# Patient Record
Sex: Female | Born: 1955 | Race: White | Hispanic: No | Marital: Married | Smoking: Current every day smoker
Health system: Southern US, Community
[De-identification: ages and names within clinical notes are randomized; demographics above are authoritative.]

## PROBLEM LIST (undated history)

## (undated) DIAGNOSIS — Z9889 Other specified postprocedural states: Secondary | ICD-10-CM

## (undated) DIAGNOSIS — T8859XA Other complications of anesthesia, initial encounter: Secondary | ICD-10-CM

## (undated) DIAGNOSIS — F99 Mental disorder, not otherwise specified: Secondary | ICD-10-CM

## (undated) DIAGNOSIS — I340 Nonrheumatic mitral (valve) insufficiency: Secondary | ICD-10-CM

## (undated) DIAGNOSIS — C4491 Basal cell carcinoma of skin, unspecified: Secondary | ICD-10-CM

## (undated) DIAGNOSIS — G43909 Migraine, unspecified, not intractable, without status migrainosus: Secondary | ICD-10-CM

## (undated) DIAGNOSIS — T4145XA Adverse effect of unspecified anesthetic, initial encounter: Secondary | ICD-10-CM

## (undated) DIAGNOSIS — I10 Essential (primary) hypertension: Secondary | ICD-10-CM

## (undated) DIAGNOSIS — R112 Nausea with vomiting, unspecified: Secondary | ICD-10-CM

## (undated) DIAGNOSIS — K219 Gastro-esophageal reflux disease without esophagitis: Secondary | ICD-10-CM

## (undated) DIAGNOSIS — IMO0002 Reserved for concepts with insufficient information to code with codable children: Secondary | ICD-10-CM

## (undated) DIAGNOSIS — E785 Hyperlipidemia, unspecified: Secondary | ICD-10-CM

## (undated) HISTORY — PX: PARTIAL HYSTERECTOMY: SHX80

## (undated) HISTORY — PX: APPENDECTOMY: SHX54

## (undated) HISTORY — PX: COSMETIC SURGERY: SHX468

## (undated) HISTORY — PX: OTHER SURGICAL HISTORY: SHX169

## (undated) HISTORY — DX: Mental disorder, not otherwise specified: F99

## (undated) HISTORY — DX: Reserved for concepts with insufficient information to code with codable children: IMO0002

## (undated) HISTORY — PX: ABDOMINAL HYSTERECTOMY: SHX81

## (undated) HISTORY — DX: Basal cell carcinoma of skin, unspecified: C44.91

## (undated) HISTORY — PX: TONSILLECTOMY: SUR1361

## (undated) HISTORY — DX: Hyperlipidemia, unspecified: E78.5

## (undated) HISTORY — PX: ANAL FISSURE REPAIR: SHX2312

## (undated) HISTORY — DX: Essential (primary) hypertension: I10

---

## 1980-07-23 DIAGNOSIS — IMO0002 Reserved for concepts with insufficient information to code with codable children: Secondary | ICD-10-CM

## 1980-07-23 DIAGNOSIS — R87619 Unspecified abnormal cytological findings in specimens from cervix uteri: Secondary | ICD-10-CM

## 1980-07-23 HISTORY — DX: Reserved for concepts with insufficient information to code with codable children: IMO0002

## 1980-07-23 HISTORY — DX: Unspecified abnormal cytological findings in specimens from cervix uteri: R87.619

## 1999-07-18 ENCOUNTER — Encounter: Payer: Self-pay | Admitting: Emergency Medicine

## 1999-07-18 ENCOUNTER — Emergency Department (HOSPITAL_COMMUNITY): Admission: EM | Admit: 1999-07-18 | Discharge: 1999-07-18 | Payer: Self-pay | Admitting: Emergency Medicine

## 2000-05-17 ENCOUNTER — Ambulatory Visit (HOSPITAL_COMMUNITY): Admission: RE | Admit: 2000-05-17 | Discharge: 2000-05-17 | Payer: Self-pay | Admitting: Neurosurgery

## 2000-05-17 ENCOUNTER — Encounter: Payer: Self-pay | Admitting: Neurosurgery

## 2000-05-21 ENCOUNTER — Encounter: Payer: Self-pay | Admitting: Neurosurgery

## 2000-05-21 ENCOUNTER — Ambulatory Visit (HOSPITAL_COMMUNITY): Admission: RE | Admit: 2000-05-21 | Discharge: 2000-05-21 | Payer: Self-pay | Admitting: Neurosurgery

## 2000-07-15 ENCOUNTER — Ambulatory Visit (HOSPITAL_COMMUNITY): Admission: RE | Admit: 2000-07-15 | Discharge: 2000-07-15 | Payer: Self-pay | Admitting: Neurosurgery

## 2000-07-15 ENCOUNTER — Encounter: Payer: Self-pay | Admitting: Neurosurgery

## 2000-07-30 ENCOUNTER — Encounter: Payer: Self-pay | Admitting: Neurosurgery

## 2000-07-30 ENCOUNTER — Ambulatory Visit (HOSPITAL_COMMUNITY): Admission: RE | Admit: 2000-07-30 | Discharge: 2000-07-30 | Payer: Self-pay | Admitting: Neurosurgery

## 2000-08-13 ENCOUNTER — Encounter: Payer: Self-pay | Admitting: Neurosurgery

## 2000-08-13 ENCOUNTER — Ambulatory Visit: Admission: RE | Admit: 2000-08-13 | Discharge: 2000-08-13 | Payer: Self-pay | Admitting: Neurosurgery

## 2001-04-08 ENCOUNTER — Other Ambulatory Visit: Admission: RE | Admit: 2001-04-08 | Discharge: 2001-04-08 | Payer: Self-pay | Admitting: *Deleted

## 2005-03-29 ENCOUNTER — Other Ambulatory Visit: Admission: RE | Admit: 2005-03-29 | Discharge: 2005-03-29 | Payer: Self-pay | Admitting: *Deleted

## 2006-09-23 ENCOUNTER — Inpatient Hospital Stay (HOSPITAL_COMMUNITY): Admission: EM | Admit: 2006-09-23 | Discharge: 2006-09-25 | Payer: Self-pay | Admitting: Emergency Medicine

## 2006-09-24 ENCOUNTER — Encounter: Payer: Self-pay | Admitting: Cardiology

## 2008-09-22 ENCOUNTER — Ambulatory Visit: Payer: Self-pay | Admitting: Cardiology

## 2008-09-23 ENCOUNTER — Inpatient Hospital Stay (HOSPITAL_COMMUNITY): Admission: EM | Admit: 2008-09-23 | Discharge: 2008-09-24 | Payer: Self-pay | Admitting: Emergency Medicine

## 2009-05-15 ENCOUNTER — Emergency Department (HOSPITAL_COMMUNITY): Admission: EM | Admit: 2009-05-15 | Discharge: 2009-05-15 | Payer: Self-pay | Admitting: Emergency Medicine

## 2009-06-23 ENCOUNTER — Ambulatory Visit (HOSPITAL_COMMUNITY): Admission: RE | Admit: 2009-06-23 | Discharge: 2009-06-23 | Payer: Self-pay | Admitting: Plastic Surgery

## 2009-06-24 ENCOUNTER — Ambulatory Visit (HOSPITAL_BASED_OUTPATIENT_CLINIC_OR_DEPARTMENT_OTHER): Admission: RE | Admit: 2009-06-24 | Discharge: 2009-06-24 | Payer: Self-pay | Admitting: Plastic Surgery

## 2009-09-27 ENCOUNTER — Ambulatory Visit (HOSPITAL_COMMUNITY): Admission: RE | Admit: 2009-09-27 | Discharge: 2009-09-27 | Payer: Self-pay | Admitting: Plastic Surgery

## 2010-10-24 LAB — POCT HEMOGLOBIN-HEMACUE: Hemoglobin: 14.7 g/dL (ref 12.0–15.0)

## 2010-11-02 LAB — POCT I-STAT, CHEM 8
BUN: <3 mg/dL — ABNORMAL LOW (ref 6–23)
Calcium, Ion: 1 mmol/L — ABNORMAL LOW (ref 1.12–1.32)
Chloride: 102 mEq/L (ref 96–112)
Creatinine, Ser: 0.8 mg/dL (ref 0.4–1.2)
Glucose, Bld: 89 mg/dL (ref 70–99)
HCT: 38 % (ref 36.0–46.0)
Hemoglobin: 12.9 g/dL (ref 12.0–15.0)
Potassium: 3.8 mEq/L (ref 3.5–5.1)
Sodium: 133 mEq/L — ABNORMAL LOW (ref 135–145)
TCO2: 25 mmol/L (ref 0–100)

## 2010-11-02 LAB — CARDIAC PANEL(CRET KIN+CKTOT+MB+TROPI)
CK, MB: 1.2 ng/mL (ref 0.3–4.0)
CK, MB: 1.4 ng/mL (ref 0.3–4.0)
Relative Index: INVALID (ref 0.0–2.5)
Relative Index: INVALID (ref 0.0–2.5)
Total CK: 52 U/L (ref 7–177)
Troponin I: <0.01 ng/mL (ref 0.00–0.06)
Troponin I: <0.01 ng/mL (ref 0.00–0.06)

## 2010-11-02 LAB — CBC
HCT: 34.7 % — ABNORMAL LOW (ref 36.0–46.0)
HCT: 36.1 % (ref 36.0–46.0)
Hemoglobin: 12.1 g/dL (ref 12.0–15.0)
Hemoglobin: 12.6 g/dL (ref 12.0–15.0)
MCHC: 34.9 g/dL (ref 30.0–36.0)
MCV: 99 fL (ref 78.0–100.0)
Platelets: 275 10*3/uL (ref 150–400)
Platelets: 298 10*3/uL (ref 150–400)
RBC: 3.51 MIL/uL — ABNORMAL LOW (ref 3.87–5.11)
RDW: 14.2 % (ref 11.5–15.5)
WBC: 7.7 10*3/uL (ref 4.0–10.5)
WBC: 7.9 10*3/uL (ref 4.0–10.5)

## 2010-11-02 LAB — POCT CARDIAC MARKERS
CKMB, poc: <1 ng/mL — ABNORMAL LOW (ref 1.0–8.0)
CKMB, poc: <1 ng/mL — ABNORMAL LOW (ref 1.0–8.0)
Myoglobin, poc: 39 ng/mL (ref 12–200)
Myoglobin, poc: 42.4 ng/mL (ref 12–200)
Troponin i, poc: <0.05 ng/mL (ref 0.00–0.09)
Troponin i, poc: <0.05 ng/mL (ref 0.00–0.09)

## 2010-11-02 LAB — BASIC METABOLIC PANEL
BUN: 3 mg/dL — ABNORMAL LOW (ref 6–23)
CO2: 27 mEq/L (ref 19–32)
Calcium: 8.7 mg/dL (ref 8.4–10.5)
Chloride: 103 mEq/L (ref 96–112)
Creatinine, Ser: 0.59 mg/dL (ref 0.4–1.2)
GFR calc Af Amer: >60 mL/min (ref >60)
GFR calc non Af Amer: >60 mL/min (ref >60)
Glucose, Bld: 95 mg/dL (ref 70–99)
Potassium: 4.2 mEq/L (ref 3.5–5.1)
Sodium: 136 mEq/L (ref 135–145)

## 2010-11-02 LAB — DIFFERENTIAL
Eosinophils Relative: 2 % (ref 0–5)
Lymphocytes Relative: 41 % (ref 12–46)
Lymphs Abs: 3.3 10*3/uL (ref 0.7–4.0)
Monocytes Absolute: 0.6 10*3/uL (ref 0.1–1.0)

## 2010-11-02 LAB — LIPID PANEL
Cholesterol: 172 mg/dL (ref 0–200)
HDL: 53 mg/dL (ref >39)
LDL Cholesterol: 91 mg/dL (ref 0–99)
Triglycerides: 142 mg/dL (ref <150)

## 2010-11-02 LAB — HEPARIN LEVEL (UNFRACTIONATED): Heparin Unfractionated: <0.1 IU/mL — ABNORMAL LOW (ref 0.30–0.70)

## 2010-11-02 LAB — PROTIME-INR
INR: 1 (ref 0.00–1.49)
Prothrombin Time: 12.9 seconds (ref 11.6–15.2)

## 2010-11-02 LAB — CK TOTAL AND CKMB (NOT AT ARMC)
CK, MB: 1.8 ng/mL (ref 0.3–4.0)
Relative Index: INVALID (ref 0.0–2.5)
Total CK: 62 U/L (ref 7–177)

## 2010-11-02 LAB — TSH: TSH: 2.182 u[IU]/mL (ref 0.350–4.500)

## 2010-12-05 NOTE — H&P (Signed)
[name] [name], [name] [name]              ACCOUNT NO.:  [id number]   MEDICAL RECORD NO.:  [id number]          PATIENT TYPE:  INP   LOCATION:  3707                         FACILITY:  [facility]   PHYSICIAN:  Marca Ancona, MD      DATE OF BIRTH:  [birth year]   DATE OF ADMISSION:  09/22/2008  DATE OF DISCHARGE:                              HISTORY & PHYSICAL   PRIMARY CARDIOLOGIST:  Peter M. Sw[place]iland, MD   HISTORY OF PRESENT ILLNESS:  This is a 55 year old with history of non-  ST-elevation MI with minor coronary artery disease at cath, who presents  with chest pain.  The patient had a non-ST-elevation MI in March 2008.  Left heart catheterization was done.  There was minor disease with a  most significant being the obstruction of a tiny intermediate branch.  This did not seem to explain the significant cardiac enzyme elevation  that she had.  It was thought that perhaps there was a component of  vasospasm.  Since that time, the patient has had occasional episodes of  chest heaviness.  They have always been with emotional stress never with  exertion.  She had a SPECT in 2009 that was negative per the patient's  report.  She has actually had no chest pain episodes for about a year.  Tonight, she was arguing with her husband.  She developed left-sided  chest heaviness with radiation to the left arm.  This lasted for several  hours and did not resolve until she had nitroglycerin in the emergency  department.  It is now completely resolved and she is on nitroglycerin  paste.  Her initial point-of-care enzymes are negative.  The patient  denies any exertional chest pain or shortness of breath.  She has good  exercise tolerance.  She does still smoke.   ALLERGIES:  No known drug allergies.  She does have MULTIPLE FOOD  allergies.   MEDICATIONS:  Fioricet p.r.n. and aspirin 81 mg daily.   PAST MEDICAL HISTORY:  1. Minor coronary artery disease.  The patient had a non-ST-elevation      MI in March 2008.   Left heart catheterization showed 20% mid LAD      stenosis, 30% proximal first diagonal stenosis and there was a tiny      less than 1 mm intermediate branch that was occluded proximally.      EF was 60%.  Peak troponin at that time was 8.02, it was thought      that the tiny intermediate branch occlusion probably could not      explain that degree of elevation of cardiac enzymes and it was      thought that perhaps she had had vasospasm.  SPECT 1 year ago was      normal per the patient.  2. Hyperlipidemia, it is currently untreated.  3. Hypertension, currently untreated, but the patient's blood pressure      is within normal limits today.  4. Tobacco abuse.  The patient has tried Chantix in the past, but she      is still smoking.  5. Chronic headaches.  6. History of appendectomy.  7. History of plastic surgeries for congenital nevi.  8. Echocardiogram in March 2008 normal LV size and function, valves      appeared normal, pulmonary artery systolic pressure was 35 mmHg.   SOCIAL HISTORY:  The patient lives in [place] with her husband.  She  is married with 3 children.  She still smokes one half to one pack a  day.   FAMILY HISTORY:  Mother had hypertension.  Father had AAA repair.   REVIEW OF SYSTEMS:  Negative except as noted in the history of present  illness.   Chest x-ray shows evidence COPD.  There is no pneumonia or pulmonary  edema.   EKG shows normal sinus rhythm.  There are very slight inferior ST  depressions, this may be normal variant.   LABORATORIES:  White count 7.9, hematocrit 36.1, and platelets 298.  Sodium 133, potassium 3.8, and creatinine 0.8.  Point-to-care cardiac  markers are negative.   PHYSICAL EXAMINATION:  VITAL SIGNS:  The patient is afebrile, heart rate  63 and regular, blood pressure 114/63, and oxygen saturation 100% on 2 L  nasal cannula.  GENERAL:  This is a thin female, in no apparent distress.  NEUROLOGIC:  Alert and oriented x3,  normal affect.  HEENT:  Normal exam.  ABDOMEN:  Soft and nontender.  No hepatosplenomegaly.  Normal bowel  sounds.  NECK:  Supple without lymphadenopathy.  There is no thyromegaly.  There  is no JVD.  CARDIOVASCULAR:  Heart regular, S1 and S2.  There is no S3.  There is an  S4.  There is no murmur.  There are 2+ posterior tibial pulses  bilaterally.  There is no peripheral edema.  There is no carotid bruit.  EXTREMITIES:  There is no clubbing or cyanosis.  LUNGS:  Clear to auscultation bilaterally with normal respiratory  effort.  MUSCULOSKELETAL:  Normal exam.  SKIN:  Normal exam.   ASSESSMENT AND PLAN:  This is a 55 year old with history of non-ST-  elevation myocardial infarction and mild coronary artery disease also  with possible vasospastic episodes, who presents with chest pain that  was associated with an argument with her husband.  1. Chest pain.  The patient's chest heaviness was associated with      emotional stress.  It did radiate to her left arm.  It resolved      with nitroglycerin.  This can certainly be vasospasm based on her      past history; however, she did have coronary artery disease on her      2008 left heart catheterization.  We will treat her as unstable      angina.  We will start a heparin drip until myocardial infarction      has been ruled out.  We will have her on aspirin.  We will cycle      her cardiac enzymes.  We will keep her on nitroglycerin paste.  We      will keep her n.p.o. for now.  We will also check a TSH.  2. Smoking.  The patient needs to quit.  3. Hypertension.  The patient does carry this diagnosis; however, she      is on no medications and her blood pressure is okay tonight.  4. Hyperlipidemia.  The patient is on no medications.  We will check      her lipids.      Marca Ancona, MD  Electronically Signed     DM/MEDQ  D:  09/23/2008  T:  09/23/2008  Job:  [id number]

## 2010-12-05 NOTE — Discharge Summary (Signed)
NAMEROAN, SAWCHUK              ACCOUNT NO.:  [id number]   MEDICAL RECORD NO.:  [id number]          PATIENT TYPE:  INP   LOCATION:  3707                         FACILITY:  [facility]   PHYSICIAN:  Peter M. Swaziland, M.D.  DATE OF BIRTH:  Jan 20, 1956   DATE OF ADMISSION:  09/22/2008  DATE OF DISCHARGE:  09/24/2008                               DISCHARGE SUMMARY   HISTORY OF PRESENT ILLNESS:  Ms. Blume is a 55 year old white female  with history of tobacco abuse, hyperlipidemia, and minor coronary artery  disease.  She was admitted in March 2008 with a non-Q-wave myocardial  infarction by enzymes.  Subsequent cardiac catheterization at that time  demonstrated minor coronary artery disease.  She had 20% disease in the  mid LAD and 30% stenosis in the proximal diagonal.  She had a very tiny  ramus branch that was occluded.  Ejection fraction was normal at 60%.  The patient had a normal SPECT Cardiolite study in December 2008.  She  has done well since that time.  She presented on this admission with  episode of acute left-sided chest pain with heaviness radiating to her  left arm, lasted several hours, and occurred after she was arguing with  her husband.  She was relieved in the emergency room with nitroglycerin.  Of note, the patient does continue to smoke.  She has not been taking  her statin drug.  Only medication she has been taking was aspirin.  She  states she had run out of nitroglycerin as well.   For details of her past medical history, social history, family history,  and physical exam, please see admission history and physical.   LABORATORY DATA:  Chest x-ray showed COPD with no active disease.  ECG  shows normal sinus rhythm with a normal ECG.  Hemoglobin 12.6,  hematocrit 36.1, platelets 298,000, and white count 7900.  Sodium 136,  potassium 4.2, chloride 103, CO2 of 27, BUN 3, creatinine 0.59, and  glucose of 95.  Coags were normal.  Calcium 8.7.  TSH was 2.182.  Point-  of-care cardiac enzymes were negative x2.  Subsequent cardiac enzymes  were negative x2.  Cholesterol is 172, LDL 91, HDL 53, and triglycerides  142.   HOSPITAL COURSE:  The patient was admitted to telemetry monitoring.  She  was initially placed on nitro paste and IV heparin.  She had no  subsequent chest pain.  The patient was convinced that this was all just  related to her emotional stress.  Following day, her heparin and nitro  paste were discontinued.  She was progressively ambulated and had no  subsequent chest pain or ECG changes.  Based on these findings, we  recommended continued medical therapy and further outpatient evaluation  with followup stress Cardiolite study.  She was discharged home on September 24, 2008 in stable condition.  We recommended resuming statin therapy  with simvastatin 20 mg per day.  She was given a prescription for  nitroglycerin p.r.n. and we renewed prescription for Chantix starter  pack to help her quit smoking.   DISCHARGE DIAGNOSES:  1. Acute  chest pain, myocardial infarction ruled out.  2. History of coronary artery disease.  3. Tobacco abuse.  4. Dyslipidemia.  5. Hypertension.  6. Chronic obstructive pulmonary disease.   DISCHARGE MEDICATIONS:  1. Baby aspirin 81 mg per day.  2. Nitroglycerin 0.4 mg daily.  3. Simvastatin 20 mg per day.  4. Chantix starter pack to take as directed.   The patient will have an outpatient stress Cardiolite study arranged at  Dr. Elvis Coil office.   Discharge status is improved.           ______________________________  Peter M. Sw[place]iland, M.D.     PMJ/MEDQ  D:  09/24/2008  T:  09/24/2008  Job:  [id number]   cc:   Antony Madura, M.D.

## 2010-12-08 NOTE — Discharge Summary (Signed)
NAMETRINTY, MARKEN              ACCOUNT NO.:  [id number]   MEDICAL RECORD NO.:  [id number]          PATIENT TYPE:  INP   LOCATION:  3735                         FACILITY:  [facility]   PHYSICIAN:  Peter M. [place], M.D.  DATE OF BIRTH:  11-02-1955   DATE OF ADMISSION:  09/23/2006  DATE OF DISCHARGE:  09/25/2006                               DISCHARGE SUMMARY   HISTORY OF PRESENT ILLNESS:  The patient is a 55 year old white female  with a history of tobacco abuse who presented with refractory chest  pain.  ECG was unremarkable, but her symptoms were consistent with  unstable angina and she was admitted for further evaluation.  Patient  has no prior history of cardiac disease.   For details of her past medical history, social history, family history  and physical exam, please see admission and physical.   LABORATORY DATA:  Initial ECG showed a normal sinus rhythm with  occasional PVC and minimal nonspecific ST abnormality.  Chest x-ray  showed emphysema without active disease.  White count was 9600,  hemoglobin 12.5, hematocrit 37.1, platelets 307,000.  Coags were normal.  Sodium 136, potassium 4.1, chloride 101, CO2 30, BUN 5, creatinine 0.6,  glucose of 90, calcium 8.9.  Total protein was 5.9 with an albumin of  3.2.  Liver function studies were all normal.  A1c was 5.5%.  Initial CK  was 79 with 4.9 MB and troponin was 0.26.  CPK subsequently increased to  a peak of 239 with a 22.6 MB and troponin peaked at 8.02.  Total  cholesterol is 155, triglycerides 99, HDL 38 and LDL of 97.   HOSPITAL COURSE:  Patient was admitted.  She was given IV load of  Lopressor and then started on oral dose.  She was loaded with aspirin  p.o.  She was begun on IV nitroglycerin and subcu Lovenox.  She did have  improvement in her chest pain, but had some ongoing chest pain, so she  was also started on IV Integrilin.  Her ECG continued to show no  significant change.  Her chest pain subsequently resolved.   The  following day, she underwent a diagnostic cardiac catheterization.  This  demonstrated mild nonobstructive disease in the LAD, up to 20% in the  mid vessel and 30% involving the first diagonal branch.  There is a very  tiny intermediate branch that was less than a millimeter diameter that  was occluded proximally.  Otherwise, the left circumflex coronary artery  and right coronary artery were normal.  Left ventricular function was  normal with an ejection fraction of 60%.  We also obtained an  echocardiogram, which was essentially normal.  No segmental wall motion  abnormalities were noted.  There was very mild tricuspid insufficiency  and mild pulmonary hypertension.  With the results of her cardiac  catheterization, her Lovenox, Integrilin and IV nitroglycerin were  discontinued.  She was maintained on oral beta-blocker and aspirin.  She  was started on statin therapy.  She had no groin complications and was  ambulatory.  She had no subsequent chest pain and was discharged home  in  stable condition on September 25, 2006.   DISCHARGE DIAGNOSES:  1. Non-Q wave myocardial infarction secondary to a very small      intermediate branch occlusion.  2. Tobacco abuse.  3. Dyslipidemia.  4. Hypertension, improved.   DISCHARGE MEDICATIONS:  1. Coated aspirin 325 mg daily.  2. Simvastatin 20 mg per day.  3. Toprol-XL 50 mg per day.  4. Nitroglycerin p.r.n.   Patient is to avoid lifting or straining for 1 week.  She is to slowly  increase her activity, but not return to work for 2 weeks.  She will  follow up with Dr. Swaziland in 2 weeks.  Her discharge status is improved.           ______________________________  Peter M. [place], M.D.     PMJ/MEDQ  D:  09/25/2006  T:  09/25/2006  Job:  [id number]   cc:   Antony Madura, M.D.

## 2010-12-08 NOTE — Cardiovascular Report (Signed)
[name] [name], [name] [name]              ACCOUNT NO.:  [id number]   MEDICAL RECORD NO.:  [id number]          PATIENT TYPE:  INP   LOCATION:  2925                         FACILITY:  [facility]   PHYSICIAN:  Peter M. Sw[place]iland, M.D.  DATE OF BIRTH:  09-Aug-1955   DATE OF PROCEDURE:  09/24/2006  DATE OF DISCHARGE:                            CARDIAC CATHETERIZATION   INDICATIONS FOR PROCEDURE:  The patient is a 55 year old white female  who presents with a non-Q-wave myocardial infarction.   PROCEDURE:  Left heart catheterization, coronary and left ventricular  angiography.   EQUIPMENT USED:  6-French 4-cm right and left Judkins catheters, 6-  French pigtail catheter, 6-French arterial sheath   MEDICATIONS:  Local anesthesia 1% Xylocaine.   CONTRAST:  130 mL of Omnipaque.   CLOSURE:  Was obtained with an Angio-Seal device with good hemostasis.   HEMODYNAMIC DATA:  Aortic pressure is 133/64 with mean of 93.  Left  ventricle pressure is 133 with EDP of 10 mmHg.   ANGIOGRAPHIC DATA:  The left coronary artery arises and distributes  normally.  The left main coronary artery is short without significant  disease.   The left anterior descending artery has a 20% narrowing in the mid  vessel.   The first diagonal branch has a 30% narrowing proximally.   There is a very tiny intermediate branch which is occluded proximally.  This is less than a millimeter in diameter.   The left circumflex coronary artery gives rise to 4 marginal branches,  and these were without significant disease.   The right coronary is a codominant vessel.  It is normal.   Left ventricular angiography was performed in RAO and LAO cranial views.  This demonstrates normal left ventricular size with no segmental wall  motion abnormalities.  Ejection fraction is estimated at 60%.  There is  no mitral regurgitation prolapse.   FINAL INTERPRETATION:  1. Single-vessel obstructive coronary disease evolving A very tiny  intermediate branch; otherwise only minor      nonobstructive disease.  2. Normal left ventricular function.   PLAN:  Would recommend continued medical therapy.           ______________________________  Peter M. [place], M.D.     PMJ/MEDQ  D:  09/24/2006  T:  09/24/2006  Job:  [id number]   cc:   Antony Madura, M.D.

## 2010-12-08 NOTE — H&P (Signed)
[name], [name]              ACCOUNT NO.:  [id number]   MEDICAL RECORD NO.:  [id number]          PATIENT TYPE:  INP   LOCATION:  2925                         FACILITY:  [facility]   PHYSICIAN:  Peter M. [place], M.D.  DATE OF BIRTH:  10/15/1955   DATE OF ADMISSION:  09/23/2006  DATE OF DISCHARGE:                              HISTORY & PHYSICAL   HISTORY OF PRESENT ILLNESS:  Ms. [name] [name] is a 55 year old white female  who walked in to the office today for evaluation of chest pain.  She  actually saw Dr. Su Hilt' medical practice initially.  He did an EKG and  sent the patient to our office for evaluation.  She states she began to  experience sudden onset of chest pain at 9:30 a.m.  She describes this  principally as a chest tightness.  It radiates down her left arm to her  wrist and into her neck.  She had some associated shortness of breath.  She denied any nausea, vomiting, or diaphoresis.  Her pain has waxed and  waned in intensity but has been of moderate severity.  She has never had  pain like this in the past, and she has no known cardiac history.  She  does have risk factors of chronic tobacco abuse, and she is  significantly hypertensive today, although she denies a history of  hypertension in the past.   PAST MEDICAL HISTORY:  Significant for chronic headaches.  She has a  prior surgery for ovarian cysts and an appendectomy.  She has also had  multiple plastic surgeries on her face for a congenital nevus.  She has  had plastic surgery on her eyelid for basal cancer.   ALLERGIES:  She has no known drug allergies.  She does report multiple  food allergies including eggs, milk, fish, melons, and corn.  She state  these food allergies cause her throat to close up.   MEDICATIONS:  Her only medication has been Fioricet that she takes as  needed.   SOCIAL HISTORY:  She is married.  She smokes one pack per day and has  been a smoker for over 20 years.  She denies alcohol abuse.   She has 3  children in their 58s.   FAMILY HISTORY:  Her father is age 48 and has had a previous abdominal  aneurysm repair.  Mother is age 7 and has hypertension.  She has one  sister in good health.   REVIEW OF SYSTEMS:  Her review of systems is otherwise unremarkable.   PHYSICAL EXAMINATION:  GENERAL:  The patient is a very thin and  chronically ill appearing white female in moderate distress.  Her weight  is 107, blood pressure is 170/90, pulse 80 and regular, respirations are  normal.  Blood pressure came down to 150/90 with sublingual  nitroglycerin.  HEENT EXAM:  She is normocephalic and atraumatic.  The pupils are equal,  round, and reactive.  Sclerae are clear.  Oropharynx is clear.  Neck is  without JVD, adenopathy, thyromegaly, or bruits.  LUNGS:  Clear to auscultation and percussion.  CARDIAC EXAM:  Reveals a  regular rate and rhythm.  Normal S1 and S2  without gallop, murmur, rub, or click.  BREASTS:  Small and nontender.  ABDOMEN:  Soft and nontender without masses or bruits.  Femoral and  pedal pulses are 2+ and symmetric.  She has no edema or cyanosis.  NEUROLOGIC EXAM:  Nonfocal.   LABORATORY DATA:  ECG obtained in Dr. Su Hilt' office showed normal  sinus rhythm with subtle nonspecific ST abnormality.  Repeat ECG in our  office again shows normal sinus rhythm with nonspecific ST abnormality.  There is no acute ST elevation.   IMPRESSION:  1. Unstable angina.  2. Tobacco abuse.  3. Hypertension poorly controlled.  4. History of chronic headaches.   PLAN:  The patient will be transported emergently to the hospital.  She  is given 4 chewable baby aspirins and 2 sublingual nitroglycerin in our  office with partial relief of her pain.  She will be started on  intravenous nitroglycerin and subcutaneously Lovenox.  She will be  loaded with intravenous Lopressor and started on a p.o. dose.  We will  obtain routine admission lab work, chest x-ray, and serial cardiac   enzymes.           ______________________________  Peter M. [place], M.D.     PMJ/MEDQ  D:  09/23/2006  T:  09/23/2006  Job:  [id number]   cc:   Antony Madura, M.D.

## 2011-02-08 ENCOUNTER — Inpatient Hospital Stay (HOSPITAL_COMMUNITY)
Admission: EM | Admit: 2011-02-08 | Discharge: 2011-02-10 | DRG: 313 | Disposition: A | Discharge status: Home / Self Care | Payer: Self-pay | Attending: Internal Medicine | Admitting: Internal Medicine

## 2011-02-08 ENCOUNTER — Emergency Department (HOSPITAL_COMMUNITY): Payer: Self-pay

## 2011-02-08 DIAGNOSIS — F17200 Nicotine dependence, unspecified, uncomplicated: Secondary | ICD-10-CM | POA: Diagnosis present

## 2011-02-08 DIAGNOSIS — I2510 Atherosclerotic heart disease of native coronary artery without angina pectoris: Secondary | ICD-10-CM | POA: Diagnosis present

## 2011-02-08 DIAGNOSIS — R110 Nausea: Secondary | ICD-10-CM | POA: Diagnosis present

## 2011-02-08 DIAGNOSIS — I2789 Other specified pulmonary heart diseases: Secondary | ICD-10-CM | POA: Diagnosis present

## 2011-02-08 DIAGNOSIS — K219 Gastro-esophageal reflux disease without esophagitis: Secondary | ICD-10-CM | POA: Diagnosis present

## 2011-02-08 DIAGNOSIS — I10 Essential (primary) hypertension: Secondary | ICD-10-CM | POA: Diagnosis present

## 2011-02-08 DIAGNOSIS — E785 Hyperlipidemia, unspecified: Secondary | ICD-10-CM | POA: Diagnosis present

## 2011-02-08 DIAGNOSIS — R0789 Other chest pain: Principal | ICD-10-CM | POA: Diagnosis present

## 2011-02-08 DIAGNOSIS — F411 Generalized anxiety disorder: Secondary | ICD-10-CM | POA: Diagnosis present

## 2011-02-08 DIAGNOSIS — I252 Old myocardial infarction: Secondary | ICD-10-CM

## 2011-02-08 DIAGNOSIS — Z7982 Long term (current) use of aspirin: Secondary | ICD-10-CM

## 2011-02-08 LAB — BASIC METABOLIC PANEL
BUN: 5 mg/dL — ABNORMAL LOW (ref 6–23)
Chloride: 98 mEq/L (ref 96–112)
GFR calc Af Amer: >60 mL/min (ref >60)
GFR calc non Af Amer: >60 mL/min (ref >60)
Potassium: 4.4 mEq/L (ref 3.5–5.1)
Sodium: 132 mEq/L — ABNORMAL LOW (ref 135–145)

## 2011-02-08 LAB — URINALYSIS, ROUTINE W REFLEX MICROSCOPIC
Bilirubin Urine: NEGATIVE
Ketones, ur: NEGATIVE mg/dL
Leukocytes, UA: NEGATIVE
Nitrite: NEGATIVE
Protein, ur: NEGATIVE mg/dL
Urobilinogen, UA: 0.2 mg/dL (ref 0.0–1.0)
pH: 6.5 (ref 5.0–8.0)

## 2011-02-08 LAB — CK TOTAL AND CKMB (NOT AT ARMC)
CK, MB: 3.1 ng/mL (ref 0.3–4.0)
Relative Index: 2.8 — ABNORMAL HIGH (ref 0.0–2.5)

## 2011-02-08 LAB — CBC
MCH: 32.5 pg (ref 26.0–34.0)
MCV: 97.2 fL (ref 78.0–100.0)
Platelets: 314 10*3/uL (ref 150–400)
RBC: 3.94 MIL/uL (ref 3.87–5.11)
RDW: 13.5 % (ref 11.5–15.5)
WBC: 8 10*3/uL (ref 4.0–10.5)

## 2011-02-08 LAB — DIFFERENTIAL
Basophils Relative: 1 % (ref 0–1)
Eosinophils Absolute: 0.2 10*3/uL (ref 0.0–0.7)
Eosinophils Relative: 2 % (ref 0–5)
Monocytes Relative: 8 % (ref 3–12)
Neutrophils Relative %: 39 % — ABNORMAL LOW (ref 43–77)

## 2011-02-08 LAB — TROPONIN I: Troponin I: <0.3 ng/mL (ref <0.30)

## 2011-02-08 LAB — URINE MICROSCOPIC-ADD ON

## 2011-02-09 DIAGNOSIS — R011 Cardiac murmur, unspecified: Secondary | ICD-10-CM

## 2011-02-09 DIAGNOSIS — R079 Chest pain, unspecified: Secondary | ICD-10-CM

## 2011-02-09 LAB — CARDIAC PANEL(CRET KIN+CKTOT+MB+TROPI)
CK, MB: 2.2 ng/mL (ref 0.3–4.0)
CK, MB: 2.1 ng/mL (ref 0.3–4.0)
CK, MB: 1.9 ng/mL (ref 0.3–4.0)
Relative Index: INVALID (ref 0.0–2.5)
Troponin I: <0.3 ng/mL (ref <0.30)

## 2011-02-09 LAB — COMPREHENSIVE METABOLIC PANEL
Albumin: 2.8 g/dL — ABNORMAL LOW (ref 3.5–5.2)
BUN: 6 mg/dL (ref 6–23)
Calcium: 9.1 mg/dL (ref 8.4–10.5)
Creatinine, Ser: 0.61 mg/dL (ref 0.50–1.10)
GFR calc Af Amer: >60 mL/min (ref >60)
Glucose, Bld: 107 mg/dL — ABNORMAL HIGH (ref 70–99)
Total Protein: 5.8 g/dL — ABNORMAL LOW (ref 6.0–8.3)

## 2011-02-09 LAB — CBC
HCT: 35.1 % — ABNORMAL LOW (ref 36.0–46.0)
Hemoglobin: 11.8 g/dL — ABNORMAL LOW (ref 12.0–15.0)
RBC: 3.6 MIL/uL — ABNORMAL LOW (ref 3.87–5.11)
RDW: 13.4 % (ref 11.5–15.5)
WBC: 11.1 10*3/uL — ABNORMAL HIGH (ref 4.0–10.5)

## 2011-02-09 LAB — MAGNESIUM: Magnesium: 2.2 mg/dL (ref 1.5–2.5)

## 2011-02-09 LAB — DIFFERENTIAL
Basophils Absolute: 0 10*3/uL (ref 0.0–0.1)
Eosinophils Relative: 2 % (ref 0–5)
Lymphocytes Relative: 31 % (ref 12–46)
Lymphs Abs: 3.5 10*3/uL (ref 0.7–4.0)
Neutro Abs: 6.5 10*3/uL (ref 1.7–7.7)
Neutrophils Relative %: 58 % (ref 43–77)

## 2011-02-09 NOTE — H&P (Signed)
[name] [name], [name] [name] NO.:  [id number]  MEDICAL RECORD NO.:  [id number]  LOCATION:  [facility]LED                         FACILITY:  [facility]  PHYSICIAN:  Talmage Nap, MD  DATE OF BIRTH:  1955-12-17  DATE OF ADMISSION:  02/08/2011 DATE OF DISCHARGE:                             HISTORY & PHYSICAL   PRIMARY CARE PHYSICIAN:  Antony Madura, MD  History obtainable from the patient.  CHIEF COMPLAINT:  Chest pain, on and off about 4 days' duration.  The patient is a 55 year old Caucasian female looking underweight with a prior history of MI, status post cardiac catheterization with clean coronaries, presenting to the emergency room with chest pain which has been on and off for about 4 days and this was said to have gotten worse 24 hours prior to presenting to the emergency room.  Pain is said to have started at rest and located in the precordial region and nonradiating.  Pain is said to be about 6/10 in intensity with associated shortness of breath.  The patient denies any history of diaphoresis. She denied any nausea and no vomiting.  No fever.  No chills.  No rigor.  Pain is said to be transient and abate without taking any medication.  This pain has been said to be on and off.  This present episode started a couple of hours prior to presenting to the emergency room with associated shortness of breath, but at this time her pain is said to be radiating to the left upper arm.  She again denied any nausea or vomiting.  No fever.  No chills.  No rigor.  No diaphoresis and subsequently presented to the emergency room to be evaluated.  PAST MEDICAL HISTORY: 1. Positive for prior MI. 2. Migraine headaches. 3. Chronic tobacco use.  PAST SURGICAL HISTORY: 1. MI, status post cardiac catheterization, normal coronaries. 2. Appendectomy. 3. Tonsillectomy.  Preadmission medications without dosages include, 1. Metoprolol. 2. Simvastatin. 3. Xanax. 4.  Fioricet.  SOCIAL HISTORY:  The patient smokes about a pack of cigarettes for 7 days for the past 10 years.  Denies history of alcohol use and she is currently unemployed.  FAMILY HISTORY:  York Spaniel to be positive for hypertension.  REVIEW OF SYSTEMS:  The patient denies any history of headaches.  No blurred vision.  No nausea or vomiting.  No fever.  No chills.  No rigor.  Chest pain as updated.  Denies any shortness of breath.  No cough.  No abdominal discomfort.  No diarrhea or hematochezia.  No dysuria or hematuria.  No swelling of the lower extremities.  No intolerance to heat or cold and no neuropsychiatric disorder.  PHYSICAL EXAMINATION:  GENERAL:  Middle-aged lady, looking underweight, not in any respiratory distress at present. VITAL SIGNS:  Blood pressure is 144/82, pulse is 60, respiratory rate 12, temperature is 98.9. HEENT:  Pupils are reactive to light and extraocular muscles are intact. NECK:  No jugular venous distention.  No carotid bruit.  No lymphadenopathy. CHEST:  Clear to auscultation. CARDIAC:  Heart sounds are 1 and 2. ABDOMEN:  Soft, nontender.  Liver, spleen, kidney not palpable.  Bowel sounds are positive. EXTREMITIES:  No pedal edema. NEUROLOGIC:  Nonfocal. MUSCULOSKELETAL SYSTEM:  Unremarkable. SKIN:  Dry. NEUROPSYCHIATRIC:  Unremarkable.  LABORATORY DATA:  Initial cardiac markers, troponin-I less than 0.30, CK- MB 3.1.  Chemistry showed sodium of 132, potassium of 4.4, chloride of 90 with a bicarbonate of 29, glucose is 78, BUN is 5, creatinine is 0.60.  Fibrin derivatives 0.46.  Urinalysis showed urine RBC 11-10 with few bacteria, leukocyte esterase and nitrite negative.  Hematological indices showed WBC of 8.0, hemoglobin of 12.8, hematocrit of 38.3, MCV of 97.2 with a platelet count of 314.  Chest x-ray normal.  EKG showed normal sinus rhythm with a rate of 57 with nonspecific T wave changes in the anterolateral leads.  IMPRESSION: 1. Unstable  angina. 2. Prior myocardial infarction status post cardiac catheterization -     normal coronaries. 3. History of migraine headaches. 4. Chronic tobacco use.  Plan is to admit the patient to Telemetry.  The patient will be on aspirin 325 mg p.o. daily, nitroglycerin 0.5 mg sublingual p.r.n. for chest pain, morphine 2 mg IV q.4 p.r.n. for chest pain.  Other medication to be given to the patient will include Lopressor 25 mg p.o. b.i.d., Diovan 80 mg p.o. daily, and Zocor 20 mg p.o. daily.  GI prophylaxis will be done, Protonix 40 mg p.o. daily and DVT prophylaxis with Lovenox 40 mg subcutaneous q.24.  Further workup to be done on this patient will include cardiac enzymes q.6 x3, CBC, CMP, and magnesium will be repeated in a.m. and Cardiology will be consulted in a.m. to evaluate the patient for possible Cardiolite stress test or cardiac catheterization.  The patient will be followed and evaluated on day-to- day basis.     Talmage Nap, MD     CN/MEDQ  D:  02/08/2011  T:  02/09/2011  Job:  [id number]  Electronically Signed by Talmage Nap  on 02/09/2011 04:15:10 AM

## 2011-02-10 LAB — CBC
Hemoglobin: 12.5 g/dL (ref 12.0–15.0)
MCV: 97.6 fL (ref 78.0–100.0)
Platelets: 283 10*3/uL (ref 150–400)
RBC: 3.82 MIL/uL — ABNORMAL LOW (ref 3.87–5.11)
WBC: 8.6 10*3/uL (ref 4.0–10.5)

## 2011-02-10 LAB — BASIC METABOLIC PANEL
CO2: 28 mEq/L (ref 19–32)
Chloride: 105 mEq/L (ref 96–112)
Creatinine, Ser: 0.51 mg/dL (ref 0.50–1.10)
Sodium: 138 mEq/L (ref 135–145)

## 2011-02-10 LAB — PHOSPHORUS: Phosphorus: 4.1 mg/dL (ref 2.3–4.6)

## 2011-02-10 LAB — LIPID PANEL
HDL: 51 mg/dL (ref >39)
Triglycerides: 131 mg/dL (ref <150)

## 2011-02-10 LAB — MAGNESIUM: Magnesium: 2.2 mg/dL (ref 1.5–2.5)

## 2011-02-10 NOTE — Consult Note (Signed)
NAMEKALSEY, [name] [name]              ACCOUNT NO.:  [id number]  MEDICAL RECORD NO.:  [id number]  LOCATION:  1419                         FACILITY:  [facility]  PHYSICIAN:  Madolyn Frieze. Jens Som, MD, FACCDATE OF BIRTH:  May 29, 1956  DATE OF CONSULTATION:  02/09/2011 DATE OF DISCHARGE:                                CONSULTATION   HISTORY OF PRESENT ILLNESS:  The patient is a 55 year old female with past medical history of mild coronary artery disease, prior myocardial infarction, hyperlipidemia, migraines who I am asked to evaluate for chest pain.  The patient underwent cardiac catheterization following a myocardial infarction in 2008.  She was found to have normal LV function with normal wall motion.  She had a 20% LAD, 30% first diagonal, and a tiny intermediate was occluded.  Her circumflex and right coronary arteries were normal.  An echocardiogram showed normal LV function.  The patient has had intermittent chest pain since that time.  She presentsfor admission on February 08, 2011, with 4 days of chest pain.  It is in the left breast area and radiates to her left upper extremity.  The pain is described as both sharp and dull.  She stated yesterday it increased with inspiration.  It does not change with movements.  There is no association to exertion or food.  She did state that it increases when she "gets pissed off."  There is associated shortness of breath, but no nausea or diaphoresis.  She was admitted and Cardiology is asked to further evaluate.  She does have some dyspnea on exertion as well.  MEDICATIONS:  Her present medications include aspirin 325 mg p.o. daily, enoxaparin 30 mg subcu daily, metoprolol 25 mg p.o. b.i.d., Benicar 10 mg daily, Protonix 40 mg p.o. b.i.d., Zocor 20 mg p.o. daily.  ALLERGIES:  She has an allergy to SULFA.  SOCIAL HISTORY:  She does smoke.  She denies alcohol use.  FAMILY HISTORY:  Negative for coronary artery disease.  PAST MEDICAL HISTORY:   Significant for hyperlipidemia, but there is no diabetes or hypertension by her report.  She does have a history of mild coronary disease as outlined in the HPI.  She also has migraines.  She has had a prior tonsillectomy, partial hysterectomy, and appendectomy.  REVIEW OF SYSTEMS:  She denies any headaches at present.  There is no fever, chills, or productive cough.  There is no hemoptysis.  There is no dysphagia, odynophagia, melena, hematochezia.  There is no dysuria, hematuria.  There is no rash or seizure activity.  There is no orthopnea, PND, or pedal edema.  There is no claudication noted. Remainder of systems are negative.  PHYSICAL EXAMINATION:  VITAL SIGNS:  Shows a blood pressure of 119/71 and her pulse is 58.  Her temperature is 98. GENERAL:  She is well developed and somewhat frail.  She is in no acute distress at present. SKIN:  Warm and dry.  She does not appear to be depressed.  There is no peripheral clubbing. BACK:  Normal. HEENT:  Normal with normal eyelids. NECK:  Supple with normal upstroke bilaterally.  There are no bruitsnoted.  There is no jugular vein distention and I cannot appreciate thyromegaly. CHEST:  Clear to auscultation.  Normal expansion. CARDIOVASCULAR:  Regular rate and rhythm with a normal S1 and S2.  There are no murmurs, rubs, or gallops noted. ABDOMEN:  Nontender, nondistended.  Positive bowel sounds.  No hepatosplenomegaly.  No masses appreciated.  There is no abdominal bruit.  She has 2+ femoral pulses bilaterally.  No bruits. EXTREMITIES:  Show no edema that I could palpate.  No cords.  She has 2+ posterior tibial pulses bilaterally. NEUROLOGIC:  Exam is grossly intact.  LABORATORY DATA:  Laboratories showed cardiac markers negative x2.  Her sodium is 135, potassium 3.5.  BUN and creatinine are 6 and 0.61.  Her alkaline phosphatase is mildly elevated at 126.  Her white blood cell count is 11.1 with hemoglobin of 11.8, hematocrit 35.1,  platelet count is 258.  Her D-dimer is normal at 0.46.  Chest x-ray shows COPD, but no active disease.  Electrocardiogram shows sinus rhythm with no ST changes.  DIAGNOSES: 1. Chest pain - the patient's symptoms are extremely atypical.  She     states they have been continuous for 4 days without ever completely     resolving.  Her electrocardiogram is normal and her initial enzymes     are negative.  If follow-up enzymes are negative, I doubt that this     is cardiac etiology.  We do recommend discharging the patient home     and planning a Myoview as an outpatient for risk stratification and     then follow up with Dr. Shirlee Latch.  We would recommend continuing     aspirin, beta-blockade, and statin. 2. Tobacco abuse - the patient was counseled on discontinuing. 3. History of coronary artery disease - she will continue on aspirin     and statin. 4. Hyperlipidemia - continue statin.  We will be happy to see the patient as an outpatient.  Please feel free to call us while she is in-house with any further questions.     Madolyn Frieze Jens Som, MD, [facility]     BSC/MEDQ  D:  02/09/2011  T:  02/09/2011  Job:  [id number]  Electronically Signed by Olga Millers MD [facility] on 02/10/2011 02:51:44 PM

## 2011-03-09 NOTE — Discharge Summary (Signed)
[name] [name], [name] [name]              ACCOUNT NO.:  [id number]  MEDICAL RECORD NO.:  [id number]  LOCATION:  1419                         FACILITY:  [facility]  PHYSICIAN:  Rosanna Randy, MDDATE OF BIRTH:  08-12-1955  DATE OF ADMISSION:  02/08/2011 DATE OF DISCHARGE:  02/10/2011                              DISCHARGE SUMMARY   PRIMARY CARE PHYSICIAN:  Dr. Burton Apley.  CARDIOLOGIST:  Marca Ancona, M.D.  DISCHARGE DIAGNOSES: 1. Chest pain atypical with negative electrocardiogram and negative     cardiac enzymes throughout hospitalization x3. 2. Hypertension, newly diagnosed, fluctuating throughout     hospitalization. 3. Hyperlipidemia. 4. Gastroesophageal reflux disease. 5. Anxiety. 6. Tobacco abuse. 7. History of migraines. 8. History of non-ST elevation myocardial infarction and mild coronary     artery disease. 9. Pulmonary hypertension, moderate.  DISCHARGE MEDICATIONS: 1. Tylenol.  650 mg q.6h.as needed for headache. 2. Alprazolam 0.25 mg 1 tablet by mouth twice a day as needed for     anxiety. 3. Metoprolol XL 50 mg 1 tablet by mouth daily. 4. Simvastatin 20 mg 1 tablet by mouth daily. 5. Nitroglycerin sublingual 0.4 mg every 5 minutes x3 as needed for     chest pain. 6. Aspirin 81 mg 1 tablet by mouth daily. 7. Protonix 40 mg 1 tablet by mouth daily.  DISPOSITION AND FOLLOWUP:  Patient had been discharged in stable, improved condition, currently not complaining of any chest pain, nausea or vomiting.  Patient is going to be arranging a followup appointment over the next 7-10 days with Dr. Su Hilt in order to review:  Patient's blood pressure and if needed started on some medications.  Her blood pressure had been fluctuating between normal range and spiking into the 150 to 160 throughout this hospitalization in more than three occasions, so it will be important to check her blood pressure one more time outside the hospital, different settings and if the blood  pressure is elevated, she will need to require some medication for blood pressure control.  It will be also important to follow on the patient resolution of her pain and to help her quitting smoking.  Patient will also make a followup appointment with Dr. Shirlee Latch, cardiologist in order to have a Myoview as an outpatient and depending results determine further evaluation and treatment.  Patient had been instructed to take her medications as prescribed, to start using a daily baby aspirin and to follow a heart healthy diet.  PROCEDURES PERFORMED DURING THIS HOSPITALIZATION:  February 08, 2011, patient had a chest x-ray that demonstrated emphysematous changes with no acute cardiopulmonary process.  Patient also had a 2-D echo on February 09, 2011 that demonstrated wall thickness in the left ventricle normal, normal systolic function, ejection fraction 60%-65%.  There is moderately increased pulmonary artery pressure of 252 mmHg, otherwise 2- D echo essentially normal.  No other procedures were performed during this admission.  Cardiology was consulted and for full details, please refer to dictation done by Dr. Ninetta Lights on February 09, 2011.  BRIEF HISTORY OF PRESENT ILLNESS:  Please refer to dictation done on February 09, 2011 by Dr. Talmage Nap, but briefly this is a 55 year old Caucasian female with a past  medical history of mild coronary artery disease, prior myocardial infarction, hyperlipidemia and migraines, who came into the hospital complaining of chest pain.  Patient reports that she has been experiencing intermittent chest pain over the last 2-3 weeks, but worse over the last 2 days prior to admission.  Patient reports that the pain is localized on the left side of her chest and radiates to her left upper extremity.  Pain is described sharp and dull. Patient's pain increases sometimes with inspiration, other times there are no changes with her breathing.  There is no association of the  pain with exertion or with food.  Patient states that the pain gets worse when she gets NAD.  She reports associated shortness of breath, but no nausea, diaphoresis or palpitations.  Patient also reports some dyspnea on exertion.  LABORATORY DATA:  Pertinent laboratory data throughout this hospitalization include a CBC with differential on arrival with a white blood cells of 8.0, hemoglobin 12.8, platelet 314,000.  Urinalysis was negative.  CNS urine microscopy and D-dimer was 0.46.  BMET on arrival showing a sodium of 132, potassium 4.4, chloride 98, bicarb 29, glucose 78, BUN 5, creatinine 0.60.  Cardiac markers negative x3.  Magnesium 2.2, phosphorus 4.1.  CBC at discharge 8.6, hemoglobin 12.5, platelets 283,000.  BMET 138 sodium, potassium 4.0, chloride 105, bicarb 28, glucose 106, BUN 8, creatinine 0.51.  HOSPITAL COURSE BY PROBLEM: 1. Patient's chest pain:  Kind of atypical with some typical features     per Cardiology recommendation after noticing no changes on EKG,     negative cardiac enzymes and normal 2-D echo, is to follow as an     outpatient with Dr. Shirlee Latch for a Myoview and depending findings of     the Myoview.  At this time, she will require no any further     evaluation and treatment.  Patient is going to continue using     aspirin.  She will continue using her beta-blocker and also with     statins.  She had been advised quitting smoking, to follow heart-     healthy diet and to take her medications as prescribed. 2. Hypertension, which is a newly-diagnosed throughout this     hospitalization with more than three occasions throughout this     admission with elevated blood pressure.  At this moment since this     is a new diagnosis and had not been all the time elevated, we are     going to hold off on starting any medication.  She will continue     using her metoprolol that will provide some control of the blood     pressure as well and she will follow with the  primary care     physician, Dr. Su Hilt for further evaluation and to determine if     she needs to be placed on any antihypertensive drugs.  Patient had     been advised to follow a low-sodium diet. 3. Hyperlipidemia:  We are going to continue statins. 4. Gastroesophageal reflux disease with some associated nausea:  We     are going to start the patient on Protonix 40 mg by mouth daily. 5. Anxiety:  We are going to continue p.r.n. alprazolam. 6. Tobacco abuse:  Patient received counseling throughout this     hospitalization and she is going to continue working together with     her primary care physician in order to quit smoking.  She also     received  a discharge 1800 quit now number for further support. 7. History of migraines:  She will continue using p.r.n. Tylenol as     needed for headaches. 8. History of NSTEMI and mild coronary artery disease:  Patient will     continue using statins, beta blocker plus aspirin. 9. Pulmonary hypertension, seen on 2-D echo:  This is most likely     secondary to the COPD that she is building up due to the tobacco     abuse.  At this point, we will recommend a close followup with     primary care physician, perform the PFTs on this patient, provide     treatment for COPD as needed, quit smoking and if she is going to     require medications for blood pressure, it will be given if she is     started on some nitrates to help with the pulmonary hypertension as     well.  PHYSICAL EXAMINATION:  VITAL SIGNS:  Her discharge temperature was 98.3, heart rate 59, respiratory rate 16, blood pressure 150/76, oxygen saturation 95% on room air. GENERAL:  Patient was in no acute distress.  She denies any shortness of breath, any chest pain, any nausea or any vomiting. RESPIRATORY SYSTEM:  Clear to auscultation bilaterally. HEART:  With mild bradycardia.  S1 and S2 auscultated. ABDOMEN:  Soft, nontender, nondistended with positive bowel sounds. EXTREMITIES:   No edema. NEUROLOGIC EXAMINATION:  Nonfocal.     Rosanna Randy, MD     CEM/MEDQ  D:  02/10/2011  T:  02/10/2011  Job:  [id number]  cc:   Dr. Lavonna Monarch, MD [address] [place] [address]  Electronically Signed by Vassie Loll MD on 03/09/2011 05:38:46 PM

## 2011-11-12 ENCOUNTER — Other Ambulatory Visit: Payer: Self-pay | Admitting: Internal Medicine

## 2011-11-12 DIAGNOSIS — R928 Other abnormal and inconclusive findings on diagnostic imaging of breast: Secondary | ICD-10-CM

## 2011-11-14 ENCOUNTER — Ambulatory Visit
Admission: RE | Admit: 2011-11-14 | Discharge: 2011-11-14 | Disposition: A | Discharge status: Home / Self Care | Payer: Self-pay | Source: Ambulatory Visit | Attending: Internal Medicine | Admitting: Internal Medicine

## 2011-11-14 DIAGNOSIS — R928 Other abnormal and inconclusive findings on diagnostic imaging of breast: Secondary | ICD-10-CM

## 2011-11-20 ENCOUNTER — Ambulatory Visit (INDEPENDENT_AMBULATORY_CARE_PROVIDER_SITE_OTHER): Payer: Self-pay | Admitting: *Deleted

## 2011-11-20 ENCOUNTER — Other Ambulatory Visit: Payer: Self-pay | Admitting: Obstetrics and Gynecology

## 2011-11-20 VITALS — BP 130/82 | HR 68 | Temp 97.4°F | Ht 65.0 in | Wt 104.0 lb

## 2011-11-20 DIAGNOSIS — Z1239 Encounter for other screening for malignant neoplasm of breast: Secondary | ICD-10-CM

## 2011-11-20 DIAGNOSIS — N6320 Unspecified lump in the left breast, unspecified quadrant: Secondary | ICD-10-CM

## 2011-11-20 DIAGNOSIS — N63 Unspecified lump in unspecified breast: Secondary | ICD-10-CM

## 2011-11-20 DIAGNOSIS — R921 Mammographic calcification found on diagnostic imaging of breast: Secondary | ICD-10-CM

## 2011-11-20 NOTE — Patient Instructions (Signed)
Taught patient how to perform BSE and gave educational materials to take home. Patient did not need a Pap smear today due to history of a hysterectomy for benign reasons. Patient referred to the [facility] for left breast stereo biopsy per recommendation of the Breast Center on 11/14/11. Appointment scheduled for Wednesday, Nov 28, 2011 at 0745. Let patient know will follow up with her within the next couple weeks with results. Patient verbalized understanding.

## 2011-11-20 NOTE — Progress Notes (Signed)
Referred from the [facility] due to needing biopsy of left breast. Diagnostic mammogram completed 11/14/11.  Pap Smear:    Pap smear not performed today. Per patients last Pap smear was around 5 years and was normal. Patient has a history of a hysterectomy around 25 years ago for benign reasons. Per patient she has had one abnormal Pap smear in 1982 and has not had any abnormal Pap smears since hysterectomy. No Pap smear results in EPIC.  Physical exam: Breasts Breasts symmetrical. No skin abnormalities bilateral breasts. No nipple retraction bilateral breasts. No nipple discharge bilateral breasts. No lymphadenopathy. No lumps palpated right breast. Palpated lump in the left breast at 2 o'clock about 1 1/2 cm from areola. Patient complained of tenderness when palpated lump. Patient referred to the [facility] for left breast stereo biopsy per recommendation of the Breast Center on 11/14/11. Appointment scheduled for Wednesday, Nov 28, 2011 at 0745.        Pelvic/Bimanual No Pap smear completed today since patient has a history of a hysterectomy for benign reasons. Pap smear not indicated per BCCCP guidelines.

## 2011-11-28 ENCOUNTER — Other Ambulatory Visit: Payer: Self-pay | Admitting: Obstetrics and Gynecology

## 2011-11-28 ENCOUNTER — Ambulatory Visit
Admission: RE | Admit: 2011-11-28 | Discharge: 2011-11-28 | Disposition: A | Discharge status: Home / Self Care | Payer: No Typology Code available for payment source | Source: Ambulatory Visit | Attending: Obstetrics and Gynecology | Admitting: Obstetrics and Gynecology

## 2011-11-28 DIAGNOSIS — R921 Mammographic calcification found on diagnostic imaging of breast: Secondary | ICD-10-CM

## 2011-11-29 ENCOUNTER — Inpatient Hospital Stay: Admission: RE | Admit: 2011-11-29 | Payer: No Typology Code available for payment source | Source: Ambulatory Visit

## 2012-01-08 ENCOUNTER — Encounter (HOSPITAL_COMMUNITY): Payer: Self-pay | Admitting: *Deleted

## 2012-01-08 ENCOUNTER — Emergency Department (HOSPITAL_COMMUNITY)
Admission: EM | Admit: 2012-01-08 | Discharge: 2012-01-08 | Disposition: A | Discharge status: Home / Self Care | Payer: Self-pay | Attending: Emergency Medicine | Admitting: Emergency Medicine

## 2012-01-08 DIAGNOSIS — E785 Hyperlipidemia, unspecified: Secondary | ICD-10-CM | POA: Insufficient documentation

## 2012-01-08 DIAGNOSIS — F17200 Nicotine dependence, unspecified, uncomplicated: Secondary | ICD-10-CM | POA: Insufficient documentation

## 2012-01-08 DIAGNOSIS — Z85828 Personal history of other malignant neoplasm of skin: Secondary | ICD-10-CM | POA: Insufficient documentation

## 2012-01-08 DIAGNOSIS — R110 Nausea: Secondary | ICD-10-CM | POA: Insufficient documentation

## 2012-01-08 DIAGNOSIS — H53149 Visual discomfort, unspecified: Secondary | ICD-10-CM | POA: Insufficient documentation

## 2012-01-08 DIAGNOSIS — Z79899 Other long term (current) drug therapy: Secondary | ICD-10-CM | POA: Insufficient documentation

## 2012-01-08 DIAGNOSIS — I10 Essential (primary) hypertension: Secondary | ICD-10-CM | POA: Insufficient documentation

## 2012-01-08 DIAGNOSIS — G43909 Migraine, unspecified, not intractable, without status migrainosus: Secondary | ICD-10-CM | POA: Insufficient documentation

## 2012-01-08 HISTORY — DX: Migraine, unspecified, not intractable, without status migrainosus: G43.909

## 2012-01-08 MED ORDER — IBUPROFEN 800 MG PO TABS
800.0000 mg | ORAL_TABLET | Freq: Once | ORAL | Status: AC
  Administered 2012-01-08: 800 mg via ORAL
  Filled 2012-01-08: qty 1

## 2012-01-08 NOTE — ED Notes (Signed)
Pt reports severe migraine x3 days - pt w/ hx of migraines, pt has taken prescribed medications at home w/o relief. Pt admits to nausea denies vomiting. Pt also w/ photophobia.

## 2012-01-08 NOTE — ED Provider Notes (Signed)
History     CSN: [id number]  Arrival date & time 01/08/12  0030   First MD Initiated Contact with Patient 01/08/12 0630      Chief Complaint  Patient presents with  . Migraine    (Consider location/radiation/quality/duration/timing/severity/associated sxs/prior treatment) Patient is a 56 y.o. female presenting with migraine. The history is provided by the patient. No language interpreter was used.  Migraine This is a new problem. The current episode started yesterday. The problem occurs constantly. The problem has been gradually improving. Associated symptoms include headaches. Pertinent negatives include no fever, nausea, neck pain, numbness, vomiting or weakness. The symptoms are aggravated by bending and stress. Treatments tried: fiorocet.   Patient complaining of a typical migraine headache. 6 Fioricet at home with no results. PCP is Dr. Su Hilt. Neurologically she is intact. Complaining of nausea but no vomiting. Pain is to the left side of her head earlier. Patient was asleep when I walked into the room.  Past Medical History  Diagnosis Date  . Hypertension   . Hyperlipidemia   . Skin cancer, basal cell   . Abnormal Pap smear 1982  . Mental disorder     anxiety   . Migraines     Past Surgical History  Procedure Date  . Partial hysterectomy   . Tonsillectomy   . Appendectomy   . Cosmetic surgery   . Reconstructive eye surgery   . Anal fissure repair     Family History  Problem Relation Age of Onset  . Hypertension Mother   . Stroke Mother   . Heart disease Mother     History  Substance Use Topics  . Smoking status: Current Everyday Smoker -- 0.5 packs/day for 20 years  . Smokeless tobacco: Never Used  . Alcohol Use: Yes     rarely    OB History    Grav Para Term Preterm Abortions TAB SAB Ect Mult Living   3 3 3       3       Review of Systems  Constitutional: Negative.  Negative for fever.  HENT: Negative for neck pain.   Eyes: Negative.     Respiratory: Negative.   Cardiovascular: Negative.   Gastrointestinal: Negative.  Negative for nausea and vomiting.  Neurological: Positive for headaches. Negative for dizziness, facial asymmetry, speech difficulty, weakness, light-headedness and numbness.  Psychiatric/Behavioral: Negative.   All other systems reviewed and are negative.    Allergies  Sulfur  Home Medications   Current Outpatient Rx  Name Route Sig Dispense Refill  . ALPRAZOLAM 0.25 MG PO TABS Oral Take 0.25 mg by mouth at bedtime as needed.    Marland Kitchen BUTALBITAL-APAP-CAFFEINE 50-325-40 MG PO TABS Oral Take 1 tablet by mouth 2 (two) times daily as needed.    Marland Kitchen METOPROLOL TARTRATE 25 MG PO TABS Oral Take 25 mg by mouth 2 (two) times daily.    Marland Kitchen NITROGLYCERIN 0.4 MG SL SUBL Sublingual Place 0.4 mg under the tongue every 5 (five) minutes as needed. For chest pain    . SIMVASTATIN 20 MG PO TABS Oral Take 20 mg by mouth every evening.      BP 143/72  Pulse 56  Temp 97.4 F (36.3 C) (Oral)  Resp 16  SpO2 99%  Physical Exam  Nursing note and vitals reviewed. Constitutional: She is oriented to person, place, and time. She appears well-developed and well-nourished.  HENT:  Head: Normocephalic and atraumatic.  Eyes: Conjunctivae and EOM are normal. Pupils are equal, round, and reactive to  light.  Neck: Normal range of motion. Neck supple.  Cardiovascular: Normal rate.   Pulmonary/Chest: Effort normal.  Abdominal: Soft.  Musculoskeletal: Normal range of motion. She exhibits no edema and no tenderness.  Neurological: She is alert and oriented to person, place, and time. She has normal strength and normal reflexes. She displays normal reflexes. No cranial nerve deficit or sensory deficit. She displays a negative Romberg sign. GCS eye subscore is 4. GCS verbal subscore is 5. GCS motor subscore is 6.  Skin: Skin is warm and dry.  Psychiatric: She has a normal mood and affect.    ED Course  Procedures (including critical  care time)  Labs Reviewed - No data to display No results found.   No diagnosis found.    MDM  56 year old female with a typical migraine headache. Complaining of photophobia and some nausea but no vomiting. Headache had resolved by the time I had assessed her. Ibuprofen given with good results. Patient will followup with Dr. Su Hilt as needed for pain.        Remi Haggard, NP 01/08/12 [phone]

## 2012-01-08 NOTE — ED Provider Notes (Signed)
Medical screening examination/treatment/procedure(s) were performed by non-physician practitioner and as supervising physician I was immediately available for consultation/collaboration.   Omer Monter L Landon Truax, MD 01/08/12 0827 

## 2012-01-08 NOTE — Discharge Instructions (Signed)
[name] [name] we gave you an ibuprofen in the ER for the pain.  You waited so long I think the pain got better on its on.  Drink plenty of water today. Follow up with Dr. Su Hilt to come up with a plan for the next migraine if you have one.  Imitrex is a good drug to take for the onset of a migraine h/a.

## 2012-04-10 ENCOUNTER — Telehealth (HOSPITAL_COMMUNITY): Payer: Self-pay | Admitting: *Deleted

## 2012-04-10 NOTE — Telephone Encounter (Signed)
Telephoned patient at mobile # and # has been disconnected or changed.

## 2012-05-02 ENCOUNTER — Other Ambulatory Visit: Payer: Self-pay | Admitting: Internal Medicine

## 2012-05-02 DIAGNOSIS — R921 Mammographic calcification found on diagnostic imaging of breast: Secondary | ICD-10-CM

## 2012-05-14 ENCOUNTER — Inpatient Hospital Stay (HOSPITAL_COMMUNITY)
Admission: EM | Admit: 2012-05-14 | Discharge: 2012-05-23 | DRG: 327 | Disposition: A | Discharge status: Home / Self Care | Payer: Managed Care, Other (non HMO) | Attending: General Surgery | Admitting: General Surgery

## 2012-05-14 ENCOUNTER — Encounter (HOSPITAL_COMMUNITY): Admission: EM | Disposition: A | Discharge status: Home / Self Care | Payer: Self-pay | Source: Home / Self Care

## 2012-05-14 ENCOUNTER — Encounter (HOSPITAL_COMMUNITY): Payer: Self-pay

## 2012-05-14 ENCOUNTER — Emergency Department (HOSPITAL_COMMUNITY): Payer: Managed Care, Other (non HMO)

## 2012-05-14 ENCOUNTER — Emergency Department (HOSPITAL_COMMUNITY): Payer: Managed Care, Other (non HMO) | Admitting: *Deleted

## 2012-05-14 ENCOUNTER — Encounter (HOSPITAL_COMMUNITY): Payer: Self-pay | Admitting: *Deleted

## 2012-05-14 DIAGNOSIS — K630 Abscess of intestine: Secondary | ICD-10-CM | POA: Diagnosis present

## 2012-05-14 DIAGNOSIS — K5710 Diverticulosis of small intestine without perforation or abscess without bleeding: Secondary | ICD-10-CM

## 2012-05-14 DIAGNOSIS — F411 Generalized anxiety disorder: Secondary | ICD-10-CM | POA: Diagnosis present

## 2012-05-14 DIAGNOSIS — K631 Perforation of intestine (nontraumatic): Secondary | ICD-10-CM

## 2012-05-14 DIAGNOSIS — K651 Peritoneal abscess: Secondary | ICD-10-CM

## 2012-05-14 DIAGNOSIS — J4489 Other specified chronic obstructive pulmonary disease: Secondary | ICD-10-CM | POA: Diagnosis present

## 2012-05-14 DIAGNOSIS — I252 Old myocardial infarction: Secondary | ICD-10-CM

## 2012-05-14 DIAGNOSIS — K660 Peritoneal adhesions (postprocedural) (postinfection): Secondary | ICD-10-CM

## 2012-05-14 DIAGNOSIS — K560 Paralytic ileus: Secondary | ICD-10-CM | POA: Diagnosis not present

## 2012-05-14 DIAGNOSIS — R112 Nausea with vomiting, unspecified: Secondary | ICD-10-CM | POA: Diagnosis present

## 2012-05-14 DIAGNOSIS — F17200 Nicotine dependence, unspecified, uncomplicated: Secondary | ICD-10-CM | POA: Diagnosis present

## 2012-05-14 DIAGNOSIS — I10 Essential (primary) hypertension: Secondary | ICD-10-CM | POA: Diagnosis present

## 2012-05-14 DIAGNOSIS — J449 Chronic obstructive pulmonary disease, unspecified: Secondary | ICD-10-CM | POA: Diagnosis present

## 2012-05-14 DIAGNOSIS — R109 Unspecified abdominal pain: Secondary | ICD-10-CM

## 2012-05-14 HISTORY — DX: Adverse effect of unspecified anesthetic, initial encounter: T41.45XA

## 2012-05-14 HISTORY — DX: Other complications of anesthesia, initial encounter: T88.59XA

## 2012-05-14 HISTORY — DX: Nausea with vomiting, unspecified: Z98.890

## 2012-05-14 HISTORY — PX: LAPAROTOMY: SHX154

## 2012-05-14 HISTORY — DX: Nausea with vomiting, unspecified: R11.2

## 2012-05-14 LAB — URINE MICROSCOPIC-ADD ON

## 2012-05-14 LAB — CBC WITH DIFFERENTIAL/PLATELET
Basophils Absolute: 0 10*3/uL (ref 0.0–0.1)
Basophils Relative: 0 % (ref 0–1)
Eosinophils Relative: 0 % (ref 0–5)
HCT: 37.8 % (ref 36.0–46.0)
Lymphocytes Relative: 9 % — ABNORMAL LOW (ref 12–46)
MCHC: 34.7 g/dL (ref 30.0–36.0)
MCV: 96.7 fL (ref 78.0–100.0)
Monocytes Absolute: 1.4 10*3/uL — ABNORMAL HIGH (ref 0.1–1.0)
Platelets: 344 10*3/uL (ref 150–400)
RDW: 13.7 % (ref 11.5–15.5)
WBC: 15.4 10*3/uL — ABNORMAL HIGH (ref 4.0–10.5)

## 2012-05-14 LAB — URINALYSIS, ROUTINE W REFLEX MICROSCOPIC
Glucose, UA: NEGATIVE mg/dL
Protein, ur: 100 mg/dL — AB
Specific Gravity, Urine: 1.027 (ref 1.005–1.030)
pH: 7 (ref 5.0–8.0)

## 2012-05-14 LAB — COMPREHENSIVE METABOLIC PANEL
ALT: 10 U/L (ref 0–35)
AST: 14 U/L (ref 0–37)
Albumin: 3.4 g/dL — ABNORMAL LOW (ref 3.5–5.2)
Calcium: 9.7 mg/dL (ref 8.4–10.5)
Creatinine, Ser: 0.5 mg/dL (ref 0.50–1.10)
Sodium: 136 mEq/L (ref 135–145)
Total Protein: 7.2 g/dL (ref 6.0–8.3)

## 2012-05-14 SURGERY — LAPAROTOMY, EXPLORATORY
Anesthesia: General | Site: Abdomen | Wound class: Dirty or Infected

## 2012-05-14 MED ORDER — METOPROLOL TARTRATE 1 MG/ML IV SOLN
5.0000 mg | Freq: Four times a day (QID) | INTRAVENOUS | Status: DC
  Administered 2012-05-15 – 2012-05-22 (×26): 5 mg via INTRAVENOUS
  Filled 2012-05-14 (×36): qty 5

## 2012-05-14 MED ORDER — ACETAMINOPHEN 10 MG/ML IV SOLN
INTRAVENOUS | Status: DC | PRN
  Administered 2012-05-14: 1000 mg via INTRAVENOUS

## 2012-05-14 MED ORDER — SUCCINYLCHOLINE CHLORIDE 20 MG/ML IJ SOLN
INTRAMUSCULAR | Status: DC | PRN
  Administered 2012-05-14: 100 mg via INTRAVENOUS

## 2012-05-14 MED ORDER — NEOSTIGMINE METHYLSULFATE 1 MG/ML IJ SOLN
INTRAMUSCULAR | Status: DC | PRN
  Administered 2012-05-14: 4 mg via INTRAVENOUS

## 2012-05-14 MED ORDER — SODIUM CHLORIDE 0.9 % IJ SOLN: 9.0000 mL | INTRAMUSCULAR | Status: DC | PRN

## 2012-05-14 MED ORDER — IOHEXOL 300 MG/ML  SOLN
100.0000 mL | Freq: Once | INTRAMUSCULAR | Status: AC | PRN
  Administered 2012-05-14: 100 mL via INTRAVENOUS

## 2012-05-14 MED ORDER — HYDROMORPHONE HCL PF 1 MG/ML IJ SOLN: 0.2500 mg | INTRAMUSCULAR | Status: DC | PRN

## 2012-05-14 MED ORDER — LACTATED RINGERS IV SOLN
INTRAVENOUS | Status: DC | PRN
  Administered 2012-05-14 (×3): via INTRAVENOUS

## 2012-05-14 MED ORDER — DIPHENHYDRAMINE HCL 12.5 MG/5ML PO ELIX: 12.5000 mg | ORAL_SOLUTION | Freq: Four times a day (QID) | ORAL | Status: DC | PRN

## 2012-05-14 MED ORDER — NALOXONE HCL 0.4 MG/ML IJ SOLN: 0.4000 mg | INTRAMUSCULAR | Status: DC | PRN

## 2012-05-14 MED ORDER — LACTATED RINGERS IV SOLN: INTRAVENOUS | Status: DC

## 2012-05-14 MED ORDER — ONDANSETRON HCL 4 MG/2ML IJ SOLN
INTRAMUSCULAR | Status: DC | PRN
  Administered 2012-05-14 (×2): 2 mg via INTRAVENOUS

## 2012-05-14 MED ORDER — MORPHINE SULFATE 4 MG/ML IJ SOLN
4.0000 mg | INTRAMUSCULAR | Status: DC | PRN
  Administered 2012-05-14: 4 mg via INTRAVENOUS
  Filled 2012-05-14: qty 1

## 2012-05-14 MED ORDER — HEPARIN SODIUM (PORCINE) 5000 UNIT/ML IJ SOLN
5000.0000 [IU] | Freq: Three times a day (TID) | INTRAMUSCULAR | Status: DC
  Administered 2012-05-15 – 2012-05-23 (×25): 5000 [IU] via SUBCUTANEOUS
  Filled 2012-05-14 (×28): qty 1

## 2012-05-14 MED ORDER — PROMETHAZINE HCL 25 MG/ML IJ SOLN
6.2500 mg | INTRAMUSCULAR | Status: DC | PRN
  Administered 2012-05-14: 6.25 mg via INTRAVENOUS

## 2012-05-14 MED ORDER — CISATRACURIUM BESYLATE (PF) 10 MG/5ML IV SOLN
INTRAVENOUS | Status: DC | PRN
  Administered 2012-05-14: 4 mg via INTRAVENOUS
  Administered 2012-05-14: 6 mg via INTRAVENOUS

## 2012-05-14 MED ORDER — PROPOFOL 10 MG/ML IV EMUL
INTRAVENOUS | Status: DC | PRN
  Administered 2012-05-14: 100 mg via INTRAVENOUS
  Administered 2012-05-14: 25 mg via INTRAVENOUS

## 2012-05-14 MED ORDER — MORPHINE SULFATE (PF) 1 MG/ML IV SOLN
INTRAVENOUS | Status: DC
  Administered 2012-05-14: via INTRAVENOUS
  Administered 2012-05-15: 2 mg via INTRAVENOUS
  Filled 2012-05-14: qty 25

## 2012-05-14 MED ORDER — ONDANSETRON HCL 4 MG/2ML IJ SOLN
4.0000 mg | Freq: Once | INTRAMUSCULAR | Status: AC
  Administered 2012-05-14: 4 mg via INTRAVENOUS
  Filled 2012-05-14: qty 2

## 2012-05-14 MED ORDER — ACETAMINOPHEN 325 MG PO TABS: 650.0000 mg | ORAL_TABLET | Freq: Four times a day (QID) | ORAL | Status: DC | PRN

## 2012-05-14 MED ORDER — GLYCOPYRROLATE 0.2 MG/ML IJ SOLN
INTRAMUSCULAR | Status: DC | PRN
  Administered 2012-05-14: 0.6 mg via INTRAVENOUS

## 2012-05-14 MED ORDER — LIDOCAINE HCL (CARDIAC) 20 MG/ML IV SOLN
INTRAVENOUS | Status: DC | PRN
  Administered 2012-05-14: 50 mg via INTRAVENOUS

## 2012-05-14 MED ORDER — PANTOPRAZOLE SODIUM 40 MG IV SOLR
40.0000 mg | Freq: Every day | INTRAVENOUS | Status: DC
  Administered 2012-05-15 – 2012-05-21 (×8): 40 mg via INTRAVENOUS
  Filled 2012-05-14 (×9): qty 40

## 2012-05-14 MED ORDER — MIDAZOLAM HCL 5 MG/5ML IJ SOLN
INTRAMUSCULAR | Status: DC | PRN
  Administered 2012-05-14: 1 mg via INTRAVENOUS

## 2012-05-14 MED ORDER — ACETAMINOPHEN 10 MG/ML IV SOLN
INTRAVENOUS | Status: AC
  Filled 2012-05-14: qty 100

## 2012-05-14 MED ORDER — DIPHENHYDRAMINE HCL 50 MG/ML IJ SOLN: 12.5000 mg | Freq: Four times a day (QID) | INTRAMUSCULAR | Status: DC | PRN

## 2012-05-14 MED ORDER — PROMETHAZINE HCL 25 MG/ML IJ SOLN
INTRAMUSCULAR | Status: AC
  Filled 2012-05-14: qty 1

## 2012-05-14 MED ORDER — ONDANSETRON HCL 4 MG/2ML IJ SOLN
4.0000 mg | Freq: Four times a day (QID) | INTRAMUSCULAR | Status: DC | PRN
  Filled 2012-05-14 (×2): qty 2

## 2012-05-14 MED ORDER — HYDROMORPHONE HCL PF 1 MG/ML IJ SOLN
INTRAMUSCULAR | Status: DC | PRN
  Administered 2012-05-14 (×2): 1 mg via INTRAVENOUS

## 2012-05-14 MED ORDER — SODIUM CHLORIDE 0.9 % IV SOLN
INTRAVENOUS | Status: DC
  Administered 2012-05-15: 01:00:00 via INTRAVENOUS

## 2012-05-14 MED ORDER — FENTANYL CITRATE 0.05 MG/ML IJ SOLN
INTRAMUSCULAR | Status: DC | PRN
  Administered 2012-05-14 (×3): 50 ug via INTRAVENOUS

## 2012-05-14 MED ORDER — SODIUM CHLORIDE 0.9 % IV SOLN
1.0000 g | Freq: Once | INTRAVENOUS | Status: AC
  Administered 2012-05-14: 1 g via INTRAVENOUS
  Filled 2012-05-14: qty 1

## 2012-05-14 MED ORDER — ACETAMINOPHEN 650 MG RE SUPP: 650.0000 mg | Freq: Four times a day (QID) | RECTAL | Status: DC | PRN

## 2012-05-14 MED ORDER — HYDROMORPHONE HCL PF 1 MG/ML IJ SOLN
INTRAMUSCULAR | Status: AC
  Filled 2012-05-14: qty 1

## 2012-05-14 MED ORDER — DEXAMETHASONE SODIUM PHOSPHATE 10 MG/ML IJ SOLN
INTRAMUSCULAR | Status: DC | PRN
  Administered 2012-05-14: 10 mg via INTRAVENOUS

## 2012-05-14 MED ORDER — SODIUM CHLORIDE 0.9 % IV BOLUS (SEPSIS)
1000.0000 mL | Freq: Once | INTRAVENOUS | Status: AC
  Administered 2012-05-14: 1000 mL via INTRAVENOUS

## 2012-05-14 MED ORDER — ONDANSETRON HCL 4 MG/2ML IJ SOLN
4.0000 mg | Freq: Four times a day (QID) | INTRAMUSCULAR | Status: DC | PRN
Start: 2012-05-14 — End: 2012-05-23
  Administered 2012-05-15 (×3): 4 mg via INTRAVENOUS
  Filled 2012-05-14: qty 2

## 2012-05-14 MED ORDER — PIPERACILLIN-TAZOBACTAM 3.375 G IVPB
3.3750 g | Freq: Three times a day (TID) | INTRAVENOUS | Status: DC
  Administered 2012-05-15 – 2012-05-22 (×23): 3.375 g via INTRAVENOUS
  Filled 2012-05-14 (×24): qty 50

## 2012-05-14 SURGICAL SUPPLY — 43 items
APPLICATOR COTTON TIP 6IN STRL (MISCELLANEOUS) ×4 IMPLANT
BANDAGE GAUZE ELAST BULKY 4 IN (GAUZE/BANDAGES/DRESSINGS) ×1 IMPLANT
BLADE EXTENDED COATED 6.5IN (ELECTRODE) IMPLANT
BLADE HEX COATED 2.75 (ELECTRODE) ×2 IMPLANT
CANISTER SUCTION 2500CC (MISCELLANEOUS) ×2 IMPLANT
CHLORAPREP W/TINT 26ML (MISCELLANEOUS) ×2 IMPLANT
CLOTH BEACON ORANGE TIMEOUT ST (SAFETY) ×2 IMPLANT
COVER MAYO STAND STRL (DRAPES) IMPLANT
DRAIN CHANNEL RND F F (WOUND CARE) ×1 IMPLANT
DRAPE LAPAROSCOPIC ABDOMINAL (DRAPES) ×2 IMPLANT
DRAPE WARM FLUID 44X44 (DRAPE) IMPLANT
DRSG PAD ABDOMINAL 8X10 ST (GAUZE/BANDAGES/DRESSINGS) ×1 IMPLANT
ELECT REM PT RETURN 9FT ADLT (ELECTROSURGICAL) ×2
ELECTRODE REM PT RTRN 9FT ADLT (ELECTROSURGICAL) ×1 IMPLANT
EVACUATOR SILICONE 100CC (DRAIN) ×1 IMPLANT
GLOVE BIO SURGEON STRL SZ7 (GLOVE) ×2 IMPLANT
GLOVE BIOGEL PI IND STRL 7.5 (GLOVE) ×1 IMPLANT
GLOVE BIOGEL PI INDICATOR 7.5 (GLOVE) ×1
GOWN STRL NON-REIN LRG LVL3 (GOWN DISPOSABLE) ×2 IMPLANT
GOWN STRL REIN XL XLG (GOWN DISPOSABLE) ×2 IMPLANT
KIT BASIN OR (CUSTOM PROCEDURE TRAY) ×2 IMPLANT
LIGASURE IMPACT 36 18CM CVD LR (INSTRUMENTS) ×1 IMPLANT
NS IRRIG 1000ML POUR BTL (IV SOLUTION) ×4 IMPLANT
PACK GENERAL/GYN (CUSTOM PROCEDURE TRAY) ×2 IMPLANT
SCALPEL HARMONIC ACE (MISCELLANEOUS) IMPLANT
SHEARS FOC LG CVD HARMONIC 17C (MISCELLANEOUS) IMPLANT
SPONGE GAUZE 4X4 12PLY (GAUZE/BANDAGES/DRESSINGS) ×2 IMPLANT
SPONGE LAP 18X18 X RAY DECT (DISPOSABLE) IMPLANT
STAPLER GUN LINEAR PROX 60 (STAPLE) ×1 IMPLANT
STAPLER VISISTAT 35W (STAPLE) ×2 IMPLANT
SUCTION POOLE TIP (SUCTIONS) IMPLANT
SUT PDS AB 1 CTX 36 (SUTURE) IMPLANT
SUT PDS AB 1 TP1 96 (SUTURE) ×1 IMPLANT
SUT SILK 2 0 (SUTURE) ×2
SUT SILK 2 0 SH CR/8 (SUTURE) ×1 IMPLANT
SUT SILK 2-0 18XBRD TIE 12 (SUTURE) ×1 IMPLANT
SUT SILK 3 0 (SUTURE) ×2
SUT SILK 3 0 SH CR/8 (SUTURE) ×1 IMPLANT
SUT SILK 3-0 18XBRD TIE 12 (SUTURE) IMPLANT
SUT VIC AB 3-0 SH 18 (SUTURE) ×1 IMPLANT
TOWEL OR 17X26 10 PK STRL BLUE (TOWEL DISPOSABLE) ×2 IMPLANT
TRAY FOLEY CATH 14FRSI W/METER (CATHETERS) IMPLANT
YANKAUER SUCT BULB TIP NO VENT (SUCTIONS) IMPLANT

## 2012-05-14 NOTE — H&P (Signed)
[name] [name] is an 56 y.o. female.   Chief Complaint: ab pain HPI: 49 yof with history of possible MI in 2008, HTN, smoking who has been undergoing evaluation for microscopic hematuria.  She had cysto on Monday.  Today she was awakened by onset of severe abdominal pain that she describes in her rlq and epigastrium.  She has been nauseated and vomiting.  She is having some diarrhea.  Her back hurts.  She only feels ok when lying in fetal position.  Nothing was making better.  She is anorectic.  She has not been taking nsaids.  Past Medical History  Diagnosis Date  . Hypertension   . Hyperlipidemia   . Skin cancer, basal cell   . Abnormal Pap smear 1982  . Mental disorder     anxiety   . Migraines   . Complication of anesthesia   . PONV (postoperative nausea and vomiting)     Past Surgical History  Procedure Date  . Partial hysterectomy   . Tonsillectomy   . Appendectomy   . Cosmetic surgery   . Reconstructive eye surgery   . Anal fissure repair     Family History  Problem Relation Age of Onset  . Hypertension Mother   . Stroke Mother   . Heart disease Mother    Social History:  reports that she has been smoking.  She has never used smokeless tobacco. She reports that she drinks alcohol. She reports that she does not use illicit drugs.  Allergies:  Allergies  Allergen Reactions  . Sulfur Other (See Comments)    Makes her extremely hyper     (Not in a hospital admission)  Results for orders placed during the hospital encounter of 05/14/12 (from the past 48 hour(s))  CBC WITH DIFFERENTIAL     Status: Abnormal   Collection Time   05/14/12  2:30 PM      Component Value Range Comment   WBC 15.4 (*) 4.0 - 10.5 K/uL    RBC 3.91  3.87 - 5.11 MIL/uL    Hemoglobin 13.1  12.0 - 15.0 g/dL    HCT 47.4  25.9 - 56.3 %    MCV 96.7  78.0 - 100.0 fL    MCH 33.5  26.0 - 34.0 pg    MCHC 34.7  30.0 - 36.0 g/dL    RDW 87.5  64.3 - 32.9 %    Platelets 344  150 - 400 K/uL    Neutrophils Relative 81 (*) 43 - 77 %    Neutro Abs 12.5 (*) 1.7 - 7.7 K/uL    Lymphocytes Relative 9 (*) 12 - 46 %    Lymphs Abs 1.5  0.7 - 4.0 K/uL    Monocytes Relative 9  3 - 12 %    Monocytes Absolute 1.4 (*) 0.1 - 1.0 K/uL    Eosinophils Relative 0  0 - 5 %    Eosinophils Absolute 0.0  0.0 - 0.7 K/uL    Basophils Relative 0  0 - 1 %    Basophils Absolute 0.0  0.0 - 0.1 K/uL   COMPREHENSIVE METABOLIC PANEL     Status: Abnormal   Collection Time   05/14/12  2:30 PM      Component Value Range Comment   Sodium 136  135 - 145 mEq/L    Potassium 4.3  3.5 - 5.1 mEq/L    Chloride 101  96 - 112 mEq/L    CO2 24  19 - 32 mEq/L  Glucose, Bld 100 (*) 70 - 99 mg/dL    BUN 7  6 - 23 mg/dL    Creatinine, Ser 1.61  0.50 - 1.10 mg/dL    Calcium 9.7  8.4 - 09.6 mg/dL    Total Protein 7.2  6.0 - 8.3 g/dL    Albumin 3.4 (*) 3.5 - 5.2 g/dL    AST 14  0 - 37 U/L    ALT 10  0 - 35 U/L    Alkaline Phosphatase 147 (*) 39 - 117 U/L    Total Bilirubin 0.2 (*) 0.3 - 1.2 mg/dL    GFR calc non Af Amer >90  >90 mL/min    GFR calc Af Amer >90  >90 mL/min   LIPASE, BLOOD     Status: Abnormal   Collection Time   05/14/12  2:30 PM      Component Value Range Comment   Lipase 60 (*) 11 - 59 U/L   URINALYSIS, ROUTINE W REFLEX MICROSCOPIC     Status: Abnormal   Collection Time   05/14/12  2:40 PM      Component Value Range Comment   Color, Urine AMBER (*) YELLOW BIOCHEMICALS MAY BE AFFECTED BY COLOR   APPearance CLOUDY (*) CLEAR    Specific Gravity, Urine 1.027  1.005 - 1.030    pH 7.0  5.0 - 8.0    Glucose, UA NEGATIVE  NEGATIVE mg/dL    Hgb urine dipstick LARGE (*) NEGATIVE    Bilirubin Urine SMALL (*) NEGATIVE    Ketones, ur TRACE (*) NEGATIVE mg/dL    Protein, ur 045 (*) NEGATIVE mg/dL    Urobilinogen, UA 1.0  0.0 - 1.0 mg/dL    Nitrite NEGATIVE  NEGATIVE    Leukocytes, UA TRACE (*) NEGATIVE   URINE MICROSCOPIC-ADD ON     Status: Abnormal   Collection Time   05/14/12  2:40 PM      Component  Value Range Comment   Squamous Epithelial / LPF RARE  RARE    WBC, UA 0-2  <3 WBC/hpf    RBC / HPF 11-20  <3 RBC/hpf    Bacteria, UA FEW (*) RARE    Urine-Other MUCOUS PRESENT      Ct Abdomen Pelvis W Contrast  05/14/2012  *RADIOLOGY REPORT*  Clinical Data: Abdominal pain.  Possible duodenal perforation.  CT ABDOMEN AND PELVIS WITH CONTRAST  Technique:  Multidetector CT imaging of the abdomen and pelvis was performed following the standard protocol during bolus administration of intravenous contrast.  Contrast:  100 ml Omnipaque-300.  Comparison: Unenhanced CT of the abdomen pelvis 05/14/2012.  Pre and postcontrast CT 05/07/2012.  Findings: Thickened appearance of the duodenum.  Surrounding the duodenum are complex fluid and air containing structures suggestive of duodenal perforation with abscess/es.  The abscess appears loculated with anterior component measuring up to 6.6 x 4.3 x 7 cm. Posterior component measures up to 4.7 x 4 x 4.5 cm.  Compression of surrounding structures.  The questionable collection of air in the right obturator fossa is not appreciated and may have represented a bowel loop.  No gross free intraperitoneal air separate from the abscess collection.  Mild prominence intrahepatic biliary ducts.  Pancreatic head is distorted by the abscess.  Adrenal and possible left renal lesion with follow up as discussed on 05/07/2012 report.  Atherosclerotic type changes of the aorta and iliac arteries.  No bony destructive lesion.  IMPRESSION: Perforated duodenal with abscess causing local mass effect suspected as detailed above.  Critical Value/emergent results were called by telephone at the time of interpretation on 05/14/2012 at 5:05 p.m. to [facility] physician's assistant,, who verbally acknowledged these results.   Original Report Authenticated By: Fuller Canada, M.D.    Dg Abd Acute W/chest  05/14/2012  *RADIOLOGY REPORT*  Clinical Data: Abdominal pain and vomiting.  ACUTE  ABDOMEN SERIES (ABDOMEN 2 VIEW & CHEST 1 VIEW)  Comparison: PA and lateral chest 02/08/2011 and CT abdomen pelvis 05/14/2012.  Findings: Single view of the chest demonstrates pulmonary hyperexpansion with clear lungs.  No pneumothorax or pleural fluid. Heart size is normal.  Two views of the abdomen show no free intraperitoneal air.  The bowel gas pattern is nonobstructive.  IMPRESSION: No acute finding chest or abdomen.  Pulmonary hyperexpansion compatible with emphysema.   Original Report Authenticated By: Bernadene Bell. Maricela Curet, M.D.     Review of Systems  Constitutional: Positive for fever and chills.  Cardiovascular: Negative for chest pain.  Gastrointestinal: Positive for nausea, vomiting, abdominal pain and diarrhea.    Blood pressure 145/71, pulse 76, temperature 100 F (37.8 C), temperature source Rectal, resp. rate 18, SpO2 100.00%. Physical Exam  Vitals reviewed. Constitutional: She appears well-developed and well-nourished.  Neck: Neck supple.  Cardiovascular: Normal rate, regular rhythm and normal heart sounds.   Respiratory: Effort normal and breath sounds normal. She has no wheezes. She has no rales.  GI: Normal appearance. Bowel sounds are decreased. There is tenderness in the right lower quadrant, epigastric area and periumbilical area. There is rigidity. No hernia. Hernia confirmed negative in the ventral area.    Lymphadenopathy:    She has no cervical adenopathy.     Assessment/Plan Likely perforated du with abscess  This is acute onset perforation with peritoneal signs on exam and elevated wbc.  I think she is best managed operatively and we discussed laparotomy with likely patch of ulcer and drainage of abscess.  Risks include (and are increased due to her smoking) bleeding, postop infection requiring drainage or reoperation, failure of patch, long term hospitalization and iv nutrition, dvt, pe, mi, stroke etc.  She and her husband both understand. Will give her abx and  proceed to or asap.   Juliyah Mergen 05/14/2012, 6:56 PM

## 2012-05-14 NOTE — ED Notes (Signed)
[id number]> Expected date:<BR> Expected time:<BR> Means of arrival:<BR> Comments:<BR> [employer].  Pt of Dr. Hillis Range.  Possible perforated duodenum.

## 2012-05-14 NOTE — Progress Notes (Signed)
ANTIBIOTIC CONSULT NOTE - INITIAL  Pharmacy Consult for Zosyn Indication: s/p surgery for perforated duodenal diverticulum  Allergies  Allergen Reactions  . Sulfur Other (See Comments)    Makes her extremely hyper    Patient Measurements: Height: 5\' 5"  (165.1 cm) Weight: 117 lb 8.1 oz (53.3 kg) IBW/kg (Calculated) : 57    Vital Signs: Temp: 97.8 F (36.6 C) (10/23 2310) Temp src: Rectal (10/23 1741) BP: 133/80 mmHg (10/23 2310) Pulse Rate: 84  (10/23 2310) Intake/Output from previous day:   Intake/Output from this shift: Total I/O In: 2800 [I.V.:2800] Out: 460 [Urine:325; Emesis/NG output:50; Drains:85]  Labs:  [facility] 05/14/12 1430  WBC 15.4*  HGB 13.1  PLT 344  LABCREA --  CREATININE 0.50   Estimated Creatinine Clearance: 64.5 ml/min (by C-G formula based on Cr of 0.5). No results found for this basename: VANCOTROUGH:2,VANCOPEAK:2,VANCORANDOM:2,GENTTROUGH:2,GENTPEAK:2,GENTRANDOM:2,TOBRATROUGH:2,TOBRAPEAK:2,TOBRARND:2,AMIKACINPEAK:2,AMIKACINTROU:2,AMIKACIN:2, in the last 72 hours   Microbiology: No results found for this or any previous visit (from the past 720 hour(s)).  Medical History: Past Medical History  Diagnosis Date  . Hypertension   . Hyperlipidemia   . Skin cancer, basal cell   . Abnormal Pap smear 1982  . Mental disorder     anxiety   . Migraines   . Complication of anesthesia   . PONV (postoperative nausea and vomiting)     Medications:  Scheduled:    . ertapenem  1 g Intravenous Once  . heparin  5,000 Units Subcutaneous Q8H  . HYDROmorphone      . metoprolol  5 mg Intravenous Q6H  . morphine   Intravenous Q4H  . ondansetron (ZOFRAN) IV  4 mg Intravenous Once  . pantoprazole (PROTONIX) IV  40 mg Intravenous QHS  . piperacillin-tazobactam (ZOSYN)  IV  3.375 g Intravenous Q8H  . promethazine      . sodium chloride  1,000 mL Intravenous Once   Infusions:    . sodium chloride    . DISCONTD: lactated ringers     Assessment: 56  yo admitted w/ abdominal pain, now s/p surgery for perforated duodenal diverticulum.  MD ordering Zosyn per RX.  Goal of Therapy:   Treat infection  Plan:   Zosyn 3.375 Gm IV q8h.  EI infusion   F/U SCr/levels as needed.  Susanne Greenhouse R 05/14/2012,11:24 PM

## 2012-05-14 NOTE — Transfer of Care (Signed)
Immediate Anesthesia Transfer of Care Note  Patient: [name] [name]  Procedure(s) Performed: Procedure(s) (LRB) with comments: EXPLORATORY LAPAROTOMY (N/A) - egd, lysis of adhesions, resection of duodenal diverticulm  Patient Location: PACU  Anesthesia Type: General  Level of Consciousness: awake and alert   Airway & Oxygen Therapy: Patient Spontanous Breathing and Patient connected to face mask oxygen  Post-op Assessment: Report given to PACU RN and Post -op Vital signs reviewed and stable  Post vital signs: Reviewed and stable  Complications: No apparent anesthesia complications

## 2012-05-14 NOTE — ED Notes (Signed)
Patient transported to CT 

## 2012-05-14 NOTE — ED Provider Notes (Signed)
History     CSN: [id number]  Arrival date & time 05/14/12  1317   First MD Initiated Contact with Patient 05/14/12 1350      Chief Complaint  Patient presents with  . Abdominal Pain    (Consider location/radiation/quality/duration/timing/severity/associated sxs/prior treatment) The history is provided by the patient, medical records and the spouse.    GENNETTE SHADIX is a 56 y.o. female presents with abdominal pain beginning gradually about 8 hours ago.  The pain was persistent, gradually worsening and constant.  Pain is described as sharp, initially located in the suprapubic region and radiating to the back, rated at a 10/10.  Pt states she has a urology procedure done Monday with visualization of the bladder.  She went to Dr Chauncey Reading office this morning 2/2 to the pain, was evaluated, given a shot for the pain and had a CT without contrast.  Dr Hillis Range was concerned about free air in the abd and sent her here for further eval and CT abd with contrast.  Pt with hx of partial hysterectomy and appendectomy.      Past Medical History  Diagnosis Date  . Hypertension   . Hyperlipidemia   . Skin cancer, basal cell   . Abnormal Pap smear 1982  . Mental disorder     anxiety   . Migraines     Past Surgical History  Procedure Date  . Partial hysterectomy   . Tonsillectomy   . Appendectomy   . Cosmetic surgery   . Reconstructive eye surgery   . Anal fissure repair     Family History  Problem Relation Age of Onset  . Hypertension Mother   . Stroke Mother   . Heart disease Mother     History  Substance Use Topics  . Smoking status: Current Every Day Smoker -- 0.5 packs/day for 20 years  . Smokeless tobacco: Never Used  . Alcohol Use: Yes     rarely    OB History    Grav Para Term Preterm Abortions TAB SAB Ect Mult Living   3 3 3       3       Review of Systems  Constitutional: Negative for fever, diaphoresis, appetite change, fatigue and unexpected weight  change.  HENT: Negative for mouth sores and neck stiffness.   Eyes: Negative for visual disturbance.  Respiratory: Negative for cough, chest tightness, shortness of breath and wheezing.   Cardiovascular: Negative for chest pain.  Gastrointestinal: Positive for nausea, vomiting and abdominal pain. Negative for diarrhea and constipation.  Genitourinary: Negative for dysuria, urgency, frequency and hematuria.  Skin: Negative for rash.  Neurological: Negative for syncope, light-headedness and headaches.  Psychiatric/Behavioral: Negative for disturbed wake/sleep cycle. The patient is not nervous/anxious.   All other systems reviewed and are negative.    Allergies  Sulfur  Home Medications   Current Outpatient Rx  Name Route Sig Dispense Refill  . ALPRAZOLAM 0.25 MG PO TABS Oral Take 0.25 mg by mouth at bedtime as needed. Anxiety    . BUTALBITAL-APAP-CAFFEINE 50-325-40 MG PO TABS Oral Take 1 tablet by mouth 2 (two) times daily as needed. Migraine    . METOPROLOL TARTRATE 25 MG PO TABS Oral Take 25 mg by mouth 2 (two) times daily.    Marland Kitchen NITROGLYCERIN 0.4 MG SL SUBL Sublingual Place 0.4 mg under the tongue every 5 (five) minutes as needed. For chest pain    . SIMVASTATIN 20 MG PO TABS Oral Take 20 mg by mouth every evening.  BP 145/71  Pulse 76  Temp 97.6 F (36.4 C) (Oral)  Resp 18  SpO2 100%  Physical Exam  Nursing note and vitals reviewed. Constitutional: She is oriented to person, place, and time. She appears well-developed and well-nourished.  HENT:  Head: Normocephalic and atraumatic.  Mouth/Throat: Oropharynx is clear and moist.  Eyes: Conjunctivae normal are normal. No scleral icterus.  Neck: Normal range of motion.  Cardiovascular: Normal rate, regular rhythm, normal heart sounds and intact distal pulses.  Exam reveals no gallop and no friction rub.   No murmur heard. Pulmonary/Chest: Effort normal and breath sounds normal. No respiratory distress. She has no wheezes.  She has no rales. She exhibits no tenderness.  Abdominal: Soft. Normal appearance and bowel sounds are normal. She exhibits no distension and no mass. There is no hepatosplenomegaly. There is generalized tenderness. There is guarding. There is no rigidity, no rebound and no CVA tenderness.       Pt very tender to light and deep palpation.   Significant pain with lying flat or attempting to walk Peritoneal signs  Musculoskeletal: Normal range of motion.  Neurological: She is alert and oriented to person, place, and time. She exhibits normal muscle tone. Coordination normal.  Skin: Skin is warm and dry. No rash noted. No pallor.  Psychiatric: She has a normal mood and affect.    ED Course  Procedures (including critical care time)  Labs Reviewed  CBC WITH DIFFERENTIAL - Abnormal; Notable for the following:    WBC 15.4 (*)     Neutrophils Relative 81 (*)     Neutro Abs 12.5 (*)     Lymphocytes Relative 9 (*)     Monocytes Absolute 1.4 (*)     All other components within normal limits  COMPREHENSIVE METABOLIC PANEL - Abnormal; Notable for the following:    Glucose, Bld 100 (*)     Albumin 3.4 (*)     Alkaline Phosphatase 147 (*)     Total Bilirubin 0.2 (*)     All other components within normal limits  LIPASE, BLOOD - Abnormal; Notable for the following:    Lipase 60 (*)     All other components within normal limits  URINALYSIS, ROUTINE W REFLEX MICROSCOPIC - Abnormal; Notable for the following:    Color, Urine AMBER (*)  BIOCHEMICALS MAY BE AFFECTED BY COLOR   APPearance CLOUDY (*)     Hgb urine dipstick LARGE (*)     Bilirubin Urine SMALL (*)     Ketones, ur TRACE (*)     Protein, ur 100 (*)     Leukocytes, UA TRACE (*)     All other components within normal limits  URINE MICROSCOPIC-ADD ON - Abnormal; Notable for the following:    Bacteria, UA FEW (*)     All other components within normal limits   Ct Abdomen Pelvis W Contrast  05/14/2012  *RADIOLOGY REPORT*  Clinical  Data: Abdominal pain.  Possible duodenal perforation.  CT ABDOMEN AND PELVIS WITH CONTRAST  Technique:  Multidetector CT imaging of the abdomen and pelvis was performed following the standard protocol during bolus administration of intravenous contrast.  Contrast:  100 ml Omnipaque-300.  Comparison: Unenhanced CT of the abdomen pelvis 05/14/2012.  Pre and postcontrast CT 05/07/2012.  Findings: Thickened appearance of the duodenum.  Surrounding the duodenum are complex fluid and air containing structures suggestive of duodenal perforation with abscess/es.  The abscess appears loculated with anterior component measuring up to 6.6 x 4.3 x 7  cm. Posterior component measures up to 4.7 x 4 x 4.5 cm.  Compression of surrounding structures.  The questionable collection of air in the right obturator fossa is not appreciated and may have represented a bowel loop.  No gross free intraperitoneal air separate from the abscess collection.  Mild prominence intrahepatic biliary ducts.  Pancreatic head is distorted by the abscess.  Adrenal and possible left renal lesion with follow up as discussed on 05/07/2012 report.  Atherosclerotic type changes of the aorta and iliac arteries.  No bony destructive lesion.  IMPRESSION: Perforated duodenal with abscess causing local mass effect suspected as detailed above.  Critical Value/emergent results were called by telephone at the time of interpretation on 05/14/2012 at 5:05 p.m. to [facility] physician's assistant,, who verbally acknowledged these results.   Original Report Authenticated By: Fuller Canada, M.D.    Dg Abd Acute W/chest  05/14/2012  *RADIOLOGY REPORT*  Clinical Data: Abdominal pain and vomiting.  ACUTE ABDOMEN SERIES (ABDOMEN 2 VIEW & CHEST 1 VIEW)  Comparison: PA and lateral chest 02/08/2011 and CT abdomen pelvis 05/14/2012.  Findings: Single view of the chest demonstrates pulmonary hyperexpansion with clear lungs.  No pneumothorax or pleural fluid. Heart size  is normal.  Two views of the abdomen show no free intraperitoneal air.  The bowel gas pattern is nonobstructive.  IMPRESSION: No acute finding chest or abdomen.  Pulmonary hyperexpansion compatible with emphysema.   Original Report Authenticated By: Bernadene Bell. Maricela Curet, M.D.     Results for orders placed during the hospital encounter of 05/14/12  CBC WITH DIFFERENTIAL      Component Value Range   WBC 15.4 (*) 4.0 - 10.5 K/uL   RBC 3.91  3.87 - 5.11 MIL/uL   Hemoglobin 13.1  12.0 - 15.0 g/dL   HCT 16.1  09.6 - 04.5 %   MCV 96.7  78.0 - 100.0 fL   MCH 33.5  26.0 - 34.0 pg   MCHC 34.7  30.0 - 36.0 g/dL   RDW 40.9  81.1 - 91.4 %   Platelets 344  150 - 400 K/uL   Neutrophils Relative 81 (*) 43 - 77 %   Neutro Abs 12.5 (*) 1.7 - 7.7 K/uL   Lymphocytes Relative 9 (*) 12 - 46 %   Lymphs Abs 1.5  0.7 - 4.0 K/uL   Monocytes Relative 9  3 - 12 %   Monocytes Absolute 1.4 (*) 0.1 - 1.0 K/uL   Eosinophils Relative 0  0 - 5 %   Eosinophils Absolute 0.0  0.0 - 0.7 K/uL   Basophils Relative 0  0 - 1 %   Basophils Absolute 0.0  0.0 - 0.1 K/uL  COMPREHENSIVE METABOLIC PANEL      Component Value Range   Sodium 136  135 - 145 mEq/L   Potassium 4.3  3.5 - 5.1 mEq/L   Chloride 101  96 - 112 mEq/L   CO2 24  19 - 32 mEq/L   Glucose, Bld 100 (*) 70 - 99 mg/dL   BUN 7  6 - 23 mg/dL   Creatinine, Ser 7.82  0.50 - 1.10 mg/dL   Calcium 9.7  8.4 - 95.6 mg/dL   Total Protein 7.2  6.0 - 8.3 g/dL   Albumin 3.4 (*) 3.5 - 5.2 g/dL   AST 14  0 - 37 U/L   ALT 10  0 - 35 U/L   Alkaline Phosphatase 147 (*) 39 - 117 U/L   Total Bilirubin 0.2 (*) 0.3 -  1.2 mg/dL   GFR calc non Af Amer >90  >90 mL/min   GFR calc Af Amer >90  >90 mL/min  LIPASE, BLOOD      Component Value Range   Lipase 60 (*) 11 - 59 U/L  URINALYSIS, ROUTINE W REFLEX MICROSCOPIC      Component Value Range   Color, Urine AMBER (*) YELLOW   APPearance CLOUDY (*) CLEAR   Specific Gravity, Urine 1.027  1.005 - 1.030   pH 7.0  5.0 - 8.0    Glucose, UA NEGATIVE  NEGATIVE mg/dL   Hgb urine dipstick LARGE (*) NEGATIVE   Bilirubin Urine SMALL (*) NEGATIVE   Ketones, ur TRACE (*) NEGATIVE mg/dL   Protein, ur 161 (*) NEGATIVE mg/dL   Urobilinogen, UA 1.0  0.0 - 1.0 mg/dL   Nitrite NEGATIVE  NEGATIVE   Leukocytes, UA TRACE (*) NEGATIVE  URINE MICROSCOPIC-ADD ON      Component Value Range   Squamous Epithelial / LPF RARE  RARE   WBC, UA 0-2  <3 WBC/hpf   RBC / HPF 11-20  <3 RBC/hpf   Bacteria, UA FEW (*) RARE   Urine-Other MUCOUS PRESENT     Ct Abdomen Pelvis W Contrast  05/14/2012  *RADIOLOGY REPORT*  Clinical Data: Abdominal pain.  Possible duodenal perforation.  CT ABDOMEN AND PELVIS WITH CONTRAST  Technique:  Multidetector CT imaging of the abdomen and pelvis was performed following the standard protocol during bolus administration of intravenous contrast.  Contrast:  100 ml Omnipaque-300.  Comparison: Unenhanced CT of the abdomen pelvis 05/14/2012.  Pre and postcontrast CT 05/07/2012.  Findings: Thickened appearance of the duodenum.  Surrounding the duodenum are complex fluid and air containing structures suggestive of duodenal perforation with abscess/es.  The abscess appears loculated with anterior component measuring up to 6.6 x 4.3 x 7 cm. Posterior component measures up to 4.7 x 4 x 4.5 cm.  Compression of surrounding structures.  The questionable collection of air in the right obturator fossa is not appreciated and may have represented a bowel loop.  No gross free intraperitoneal air separate from the abscess collection.  Mild prominence intrahepatic biliary ducts.  Pancreatic head is distorted by the abscess.  Adrenal and possible left renal lesion with follow up as discussed on 05/07/2012 report.  Atherosclerotic type changes of the aorta and iliac arteries.  No bony destructive lesion.  IMPRESSION: Perforated duodenal with abscess causing local mass effect suspected as detailed above.  Critical Value/emergent results were  called by telephone at the time of interpretation on 05/14/2012 at 5:05 p.m. to [facility] physician's assistant,, who verbally acknowledged these results.   Original Report Authenticated By: Fuller Canada, M.D.    Dg Abd Acute W/chest  05/14/2012  *RADIOLOGY REPORT*  Clinical Data: Abdominal pain and vomiting.  ACUTE ABDOMEN SERIES (ABDOMEN 2 VIEW & CHEST 1 VIEW)  Comparison: PA and lateral chest 02/08/2011 and CT abdomen pelvis 05/14/2012.  Findings: Single view of the chest demonstrates pulmonary hyperexpansion with clear lungs.  No pneumothorax or pleural fluid. Heart size is normal.  Two views of the abdomen show no free intraperitoneal air.  The bowel gas pattern is nonobstructive.  IMPRESSION: No acute finding chest or abdomen.  Pulmonary hyperexpansion compatible with emphysema.   Original Report Authenticated By: Bernadene Bell. D'ALESSIO, M.D.      1. Abdominal  pain, other specified site   2. Small bowel perforation   3. Intra-abdominal abscess       MDM  Lupita Leash  L [place] presents with abdominal pain.  Concern for SBO vs small bowel perforation vs UTI/pyelonephritis.  Positive peritoneal signs on exam.  CBC with leukocytosis and L shift.  CMP unremarkable, Lipase mildly elevated at 60.  CT abd with perforated duodenal with abscess causing local mass effect called to me by Lacy Duverney, MD.  Acute abd series without free air.  Pt in NAD, nontoxic, non-septic appearing.  Afebrile, VSS, non-tachycardic.  General surgery referral called and patient admitted.  Dr. Estell Harpin was consulted, evaluated this patient with me and agrees with the plan.             Dahlia Client Vannia Pola, PA-C 05/14/12 2055

## 2012-05-14 NOTE — Op Note (Signed)
Preoperative diagnosis: Likely perforated duodenal ulcer Postoperative diagnosis: Perforated duodenal diverticulum Procedure: #1 exploratory laparotomy with lysis of adhesions x30 minutes #2 resection of duodenal diverticulum #3 EGD Surgeon: Dr. Harden Mo Asst.: Dr. Carman Ching Anesthesia: GETA Specimen: duodenal diverticulum Drains: 19Fr Blake drain  Complications: None Estimated blood loss: Minimal Sponge and needle count correct x2 at end of operation Disposition to recovery stable  Indications: This is a 56 year old female who had early acute onset of abdominal pain this morning. His pain is in her epigastrium and her right lower quadrant. She presented with elevated white blood cell count and a CT scan there was concern for a perforated duodenal ulcer. On her examination she had peritoneal signs. I discussed a laparotomy for what appeared at that point to be a perforated duodenal ulcer.  Procedure: After informed consent was obtained the patient was taken to the operating room. She was administered 1 g of Invanz. Sequential compression devices were on her legs. She was placed under general endotracheal anesthesia without complication. Foley catheter was placed. Her abdomen was then prepped and draped in the standard sterile surgical fashion. A surgical timeout was performed.  An upper midline incision was made that extended below her umbilicus. This encompassed a portion of her old incision. I entered her abdomen without difficulty. I then inserted a Balfour retractor. I spent about 30 minutes lysing the adhesions from her prior surgery most of which from her omentum down to her pelvis. I also mobilized her right colon with some difficulty as there was a fair amount of scar tissue as well. The colon was folded on itself and I lysed these adhesions. I made a small serosal tear doing this and repair this with a 3-0 silk suture. There was not an obvious perforated ulcer and I thought  initially it may have been his colon folded on itself that was causing her problems. Once I had mobilized the colon I then identified the duodenum. I identified the stomach and the pylorus. I eventually did a Kocher maneuver and mobilized the duodenum. It became clear during this that the problem was stemming from a very large duodenal diverticulum. This duodenal diverticulum had a necrotic portion that was at the apex of it and it had a small hole and was leaking intestinal contents. Once I had freed this up I elected to use a TX stapler and resect this. I then oversewed this with 2-0 silk suture. Dr. Magnus Ivan came into the operating room and placed the endoscope and we went all the way past this area and the duodenum was widely patent. This area was also airtight under pressure. There was no leak evident as it was under water. We then backed this out and decompressed her stomach. We placed a nasogastric tube. I then irrigated copiously. I placed a Blake drain near my suture line. I then closed the abdomen with #1 looped PDS. I stapled the umbilicus and packed the remaining wound. Dressings were placed. She tolerated this well was extubated and transferred to recovery stable.

## 2012-05-14 NOTE — Anesthesia Postprocedure Evaluation (Signed)
Anesthesia Post Note  Patient: [name] [name]  Procedure(s) Performed: Procedure(s) (LRB): EXPLORATORY LAPAROTOMY (N/A)  Anesthesia type: General  Patient location: PACU  Post pain: Pain level controlled  Post assessment: Post-op Vital signs reviewed  Last Vitals:  Filed Vitals:   05/14/12 2310  BP: 133/80  Pulse: 84  Temp: 36.6 C  Resp: 14    Post vital signs: Reviewed  Level of consciousness: sedated  Complications: No apparent anesthesia complications

## 2012-05-14 NOTE — ED Notes (Signed)
Patient was sent from Dr. Chauncey Reading office with a possible duodenal perforation. Patient had a CT Scan of abdomen due to severe abdominal pain and vomiting. Patient was sent to the ED for pain meds and further evaluation with a CT abdomen w/contrast.

## 2012-05-14 NOTE — Anesthesia Preprocedure Evaluation (Addendum)
Anesthesia Evaluation  Patient identified by MRN, date of birth, ID band Patient awake    Reviewed: Allergy & Precautions, H&P , NPO status , Patient's Chart, lab work & pertinent test results  History of Anesthesia Complications (+) PONV  Airway       Dental   Pulmonary COPD (per CXR)Current Smoker,          Cardiovascular hypertension, Pt. on medications and Pt. on home beta blockers + Past MI (MI in 2008 per patient; states cardiac cath. was normal)     Neuro/Psych  Headaches, Anxiety    GI/Hepatic negative GI ROS, Neg liver ROS,   Endo/Other  negative endocrine ROS  Renal/GU negative Renal ROS  negative genitourinary   Musculoskeletal negative musculoskeletal ROS (+)   Abdominal   Peds  Hematology negative hematology ROS (+)   Anesthesia Other Findings   Reproductive/Obstetrics negative OB ROS                           Anesthesia Physical Anesthesia Plan  ASA: III and Emergent  Anesthesia Plan: General   Post-op Pain Management:    Induction: Intravenous, Rapid sequence and Cricoid pressure planned  Airway Management Planned: Oral ETT  Additional Equipment:   Intra-op Plan:   Post-operative Plan: Extubation in OR  Informed Consent: I have reviewed the patients History and Physical, chart, labs and discussed the procedure including the risks, benefits and alternatives for the proposed anesthesia with the patient or authorized representative who has indicated his/her understanding and acceptance.   Dental advisory given  Plan Discussed with: CRNA  Anesthesia Plan Comments:         Anesthesia Quick Evaluation

## 2012-05-15 LAB — CBC
HCT: 31.7 % — ABNORMAL LOW (ref 36.0–46.0)
MCHC: 33.8 g/dL (ref 30.0–36.0)
MCV: 96.9 fL (ref 78.0–100.0)
RDW: 13.4 % (ref 11.5–15.5)

## 2012-05-15 LAB — BASIC METABOLIC PANEL
BUN: 7 mg/dL (ref 6–23)
CO2: 25 mEq/L (ref 19–32)
Chloride: 101 mEq/L (ref 96–112)
Creatinine, Ser: 0.58 mg/dL (ref 0.50–1.10)

## 2012-05-15 MED ORDER — KETOROLAC TROMETHAMINE 15 MG/ML IJ SOLN
15.0000 mg | Freq: Once | INTRAMUSCULAR | Status: AC
  Administered 2012-05-15: 15 mg via INTRAVENOUS
  Filled 2012-05-15: qty 1

## 2012-05-15 MED ORDER — MORPHINE SULFATE (PF) 1 MG/ML IV SOLN
INTRAVENOUS | Status: DC
  Administered 2012-05-15 (×2): 2 mg via INTRAVENOUS
  Administered 2012-05-15: 3 mg via INTRAVENOUS
  Administered 2012-05-15 – 2012-05-16 (×2): 1 mg via INTRAVENOUS
  Administered 2012-05-16: 23:00:00 via INTRAVENOUS
  Administered 2012-05-16 (×2): 2 mg via INTRAVENOUS
  Administered 2012-05-17: 1 mg via INTRAVENOUS
  Administered 2012-05-17: 2 mg via INTRAVENOUS
  Administered 2012-05-17: 1 mg via INTRAVENOUS
  Administered 2012-05-17: 2 mg via INTRAVENOUS
  Administered 2012-05-18 – 2012-05-19 (×4): 1 mg via INTRAVENOUS
  Filled 2012-05-15: qty 25

## 2012-05-15 MED ORDER — KCL IN DEXTROSE-NACL 20-5-0.45 MEQ/L-%-% IV SOLN
INTRAVENOUS | Status: DC
  Administered 2012-05-15 – 2012-05-16 (×4): via INTRAVENOUS
  Filled 2012-05-15 (×6): qty 1000

## 2012-05-15 MED ORDER — DIPHENHYDRAMINE HCL 50 MG/ML IJ SOLN: 12.5000 mg | Freq: Four times a day (QID) | INTRAMUSCULAR | Status: DC | PRN

## 2012-05-15 MED ORDER — ONDANSETRON HCL 4 MG/2ML IJ SOLN: 4.0000 mg | Freq: Four times a day (QID) | INTRAMUSCULAR | Status: DC | PRN

## 2012-05-15 MED ORDER — DIPHENHYDRAMINE HCL 12.5 MG/5ML PO ELIX: 12.5000 mg | ORAL_SOLUTION | Freq: Four times a day (QID) | ORAL | Status: DC | PRN

## 2012-05-15 MED ORDER — SODIUM CHLORIDE 0.9 % IJ SOLN: 9.0000 mL | INTRAMUSCULAR | Status: DC | PRN

## 2012-05-15 MED ORDER — NALOXONE HCL 0.4 MG/ML IJ SOLN: 0.4000 mg | INTRAMUSCULAR | Status: DC | PRN

## 2012-05-15 MED ORDER — LIP MEDEX EX OINT
TOPICAL_OINTMENT | CUTANEOUS | Status: AC
  Administered 2012-05-15: 03:00:00
  Filled 2012-05-15: qty 7

## 2012-05-15 NOTE — Progress Notes (Signed)
Dr. Carolynne Edouard aware via phone that stain marked on abdominal dressing this am covered 1/4 on right side.  At present stain has extended beyond margins of original markings. MD aware only 10ml emptied from JP drain today so far. No new orders received at this time.

## 2012-05-15 NOTE — Progress Notes (Signed)
1 Day Post-Op  Subjective: Sore today, pain fair control, discussed operative findings  Objective: Vital signs in last 24 hours: Temp:  [97.6 F (36.4 C)-100 F (37.8 C)] 99.1 F (37.3 C) (10/24 0523) Pulse Rate:  [67-92] 81  (10/24 0523) Resp:  [10-20] 14  (10/24 0523) BP: (112-145)/(55-80) 144/68 mmHg (10/24 0523) SpO2:  [92 %-100 %] 96 % (10/24 0523) Weight:  [117 lb 8.1 oz (53.3 kg)] 117 lb 8.1 oz (53.3 kg) (10/23 2319)    Intake/Output from previous day: 10/23 0701 - 10/24 0700 In: 3373.3 [I.V.:3283.3; NG/GT:30; IV Piggyback:60] Out: 1120 [Urine:875; Emesis/NG output:150; Drains:95] Intake/Output this shift:    General appearance: no distress Resp: clear to auscultation bilaterally Cardio: regular rate and rhythm GI: abnormal findings:  dressing dry, no bs, drain with serosang fluid  Lab Results:   Basename 05/15/12 0420 05/14/12 1430  WBC 13.8* 15.4*  HGB 11.1* 13.1  HCT 31.7* 37.8  PLT 277 344   BMET  Basename 05/15/12 0420 05/14/12 1430  NA 133* 136  K 4.4 4.3  CL 101 101  CO2 25 24  GLUCOSE 133* 100*  BUN 7 7  CREATININE 0.58 0.50  CALCIUM 8.9 9.7    Assessment/Plan: POD 1 elap, resection duodenal diverticulum that had perforated, egd 1. Increase pca, will give a dose of toradol also, this was not perforated ulcer 2. pulm toilet, oob 3. Cont ng tube, jp drain, npo 4. Dc foley, change ivf 5. Heparin, scds 6. Dressing changes  LOS: 1 day    [facility] 05/15/2012

## 2012-05-15 NOTE — ED Provider Notes (Signed)
Medical screening examination/treatment/procedure(s) were performed by non-physician practitioner and as supervising physician I was immediately available for consultation/collaboration.   Genelle Economou, MD 05/15/12 0731 

## 2012-05-15 NOTE — Care Management Note (Signed)
    Page 1 of 1   05/15/2012     11:43:08 AM   CARE MANAGEMENT NOTE 05/15/2012  Patient:  [name] [name], [name] [name]   Account Number:  [id number]  Date Initiated:  05/15/2012  Documentation initiated by:  Lorenda Ishihara  Subjective/Objective Assessment:   56 yo female admitted s/p open lap with lysis and duodenal resection for perf. PTA lived at home with spouse.     Action/Plan:   d/c home when stable   Anticipated DC Date:  05/19/2012   Anticipated DC Plan:  HOME/SELF CARE      DC Planning Services  CM consult      Choice offered to / List presented to:             Status of service:  Completed, signed off Medicare Important Message given?   (If response is "NO", the following Medicare IM given date fields will be blank) Date Medicare IM given:   Date Additional Medicare IM given:    Discharge Disposition:  HOME/SELF CARE  Per UR Regulation:  Reviewed for med. necessity/level of care/duration of stay  If discussed at Long Length of Stay Meetings, dates discussed:    Comments:

## 2012-05-16 ENCOUNTER — Encounter (HOSPITAL_COMMUNITY): Payer: Self-pay | Admitting: General Surgery

## 2012-05-16 NOTE — Progress Notes (Signed)
Patient ID: [name] [name], female   DOB: 1956-07-17, 56 y.o.   MRN: [id number] 2 Days Post-Op  Subjective: Expected soreness, denies flatus or BM.  Walking, mild headache today  Objective: Vital signs in last 24 hours: Temp:  [98.3 F (36.8 C)-100 F (37.8 C)] 100 F (37.8 C) (10/25 1003) Pulse Rate:  [68-85] 80  (10/25 1003) Resp:  [14-21] 15  (10/25 1003) BP: (130-160)/(70-80) 153/77 mmHg (10/25 1003) SpO2:  [96 %-98 %] 96 % (10/25 1003) Last BM Date: 05/14/12  Intake/Output from previous day: 10/24 0701 - 10/25 0700 In: 1990 [I.V.:1910; NG/GT:30; IV Piggyback:50] Out: 1860 [Urine:1790; Emesis/NG output:50; Drains:20] Intake/Output this shift: Total I/O In: -  Out: 750 [Urine:750]  General appearance: alert, cooperative and no distress GI: soft, mildly tender over insicion, drain with serous op, dressings dry  Lab Results:   Basename 05/15/12 0420 05/14/12 1430  WBC 13.8* 15.4*  HGB 11.1* 13.1  HCT 31.7* 37.8  PLT 277 344   BMET  Basename 05/15/12 0420 05/14/12 1430  NA 133* 136  K 4.4 4.3  CL 101 101  CO2 25 24  GLUCOSE 133* 100*  BUN 7 7  CREATININE 0.58 0.50  CALCIUM 8.9 9.7    Assessment/Plan: 1. POD#2: exploratory laparotomy with lysis of adhesions, resection of duodenal diverticulum and EGD: doing well overall, no bowel function yet  --continue pulm toilet, oob  --Cont ng tube, jp drain, npo  --add iv tylenol for headache/pain  --Heparin, scds  --Dressing changes  LOS: 2 days    [name] [name] 05/16/2012

## 2012-05-17 MED ORDER — KCL IN DEXTROSE-NACL 20-5-0.45 MEQ/L-%-% IV SOLN
INTRAVENOUS | Status: DC
  Administered 2012-05-17 – 2012-05-22 (×8): via INTRAVENOUS
  Filled 2012-05-17 (×10): qty 1000

## 2012-05-17 NOTE — Progress Notes (Signed)
Patient ID: [name] [name], female   DOB: 07/01/1956, 56 y.o.   MRN: [id number] 3 Days Post-Op  Subjective: Expected soreness, denies flatus or BM.  Walking, urinating quite a bit  Objective: Vital signs in last 24 hours: Temp:  [98.4 F (36.9 C)-99.4 F (37.4 C)] 98.6 F (37 C) (10/26 1000) Pulse Rate:  [67-72] 68  (10/26 1000) Resp:  [13-19] 18  (10/26 1000) BP: (144-167)/(75-85) 167/77 mmHg (10/26 1000) SpO2:  [93 %-99 %] 98 % (10/26 1000) Last BM Date: 05/14/12  Intake/Output from previous day: 10/25 0701 - 10/26 0700 In: 1861.7 [I.V.:1861.7] Out: 3065 [Urine:2800; Emesis/NG output:250; Drains:15] Intake/Output this shift: Total I/O In: 0  Out: 865 [Urine:600; Emesis/NG output:250; Drains:15]  General appearance: alert, cooperative and no distress GI: soft, mildly tender over insicion, drain with serous op, dressings dry NG with thick bilious output  Lab Results:   Basename 05/15/12 0420 05/14/12 1430  WBC 13.8* 15.4*  HGB 11.1* 13.1  HCT 31.7* 37.8  PLT 277 344   BMET  Basename 05/15/12 0420 05/14/12 1430  NA 133* 136  K 4.4 4.3  CL 101 101  CO2 25 24  GLUCOSE 133* 100*  BUN 7 7  CREATININE 0.58 0.50  CALCIUM 8.9 9.7    Assessment/Plan: 1. POD#3: exploratory laparotomy with lysis of adhesions, resection of duodenal diverticulum and EGD: doing well overall, no bowel function yet  --continue pulm toilet, oob  --Cont ng tube, jp drain, npo  --Heparin, scds for DVT prophylaxis  --Cont dressing changes  --decrease IVF's to 17ml/h  LOS: 3 days    [name] Whitner C. 05/17/2012

## 2012-05-18 LAB — BASIC METABOLIC PANEL
BUN: 5 mg/dL — ABNORMAL LOW (ref 6–23)
CO2: 28 mEq/L (ref 19–32)
Calcium: 9.4 mg/dL (ref 8.4–10.5)
Chloride: 99 mEq/L (ref 96–112)
Creatinine, Ser: 0.54 mg/dL (ref 0.50–1.10)
Glucose, Bld: 118 mg/dL — ABNORMAL HIGH (ref 70–99)

## 2012-05-18 LAB — CBC
HCT: 31.2 % — ABNORMAL LOW (ref 36.0–46.0)
Hemoglobin: 10.7 g/dL — ABNORMAL LOW (ref 12.0–15.0)
MCH: 32.5 pg (ref 26.0–34.0)
MCV: 94.8 fL (ref 78.0–100.0)
RBC: 3.29 MIL/uL — ABNORMAL LOW (ref 3.87–5.11)

## 2012-05-18 NOTE — Progress Notes (Signed)
ANTIBIOTIC CONSULT NOTE - FOLLOW UP  Pharmacy Consult for Zosyn Indication: Resection of duodenal diverticulum  Allergies  Allergen Reactions  . Sulfur Other (See Comments)    Makes her extremely hyper    Patient Measurements: Height: 5\' 5"  (165.1 cm) Weight: 117 lb 8.1 oz (53.3 kg) IBW/kg (Calculated) : 57   Vital Signs: Temp: 98.1 F (36.7 C) (10/27 0500) Temp src: Oral (10/27 0500) BP: 138/71 mmHg (10/27 0500) Pulse Rate: 67  (10/27 0500) Intake/Output from previous day: 10/26 0701 - 10/27 0700 In: 1617.5 [I.V.:1552.5; NG/GT:15; IV Piggyback:50] Out: 3031 [Urine:2100; Emesis/NG output:900; Drains:31] Intake/Output from this shift:    Labs:  [facility] 05/18/12 0444  WBC 9.9  HGB 10.7*  PLT 310  LABCREA --  CREATININE 0.54   Estimated Creatinine Clearance: 64.5 ml/min (by C-G formula based on Cr of 0.54).  Microbiology: No results found for this or any previous visit (from the past 720 hour(s)).  Anti-infectives     Start     Dose/Rate Route Frequency Ordered Stop   05/14/12 2359  piperacillin-tazobactam (ZOSYN) IVPB 3.375 g       3.375 g 12.5 mL/hr over 240 Minutes Intravenous Every 8 hours 05/14/12 2324     05/14/12 1900   ertapenem (INVANZ) 1 g in sodium chloride 0.9 % 50 mL IVPB        1 g 100 mL/hr over 30 Minutes Intravenous  Once 05/14/12 1855 05/14/12 1930          Assessment:  56 YOF s/p resection of perforated duodenal diverticulum on 10/23  Day #4 zosyn  Tmax 99.1, WBC elevated but improving, renal function stable  Goal of Therapy:  Appropriate dosing for renal function  Plan:   Continue Zosyn 3.375gm IV q8h (4hr extended infusions)  Follow renal function  Follow up planned duration of therapy  Loralee Pacas, PharmD, BCPS Pager: [phone] 05/18/2012,8:56 AM

## 2012-05-18 NOTE — Progress Notes (Signed)
Patient ID: [name] [name], female   DOB: 1956-01-11, 56 y.o.   MRN: [id number] 4 Days Post-Op  Subjective: Expected soreness, denies flatus or BM.  Walking in halls  Objective: Vital signs in last 24 hours: Temp:  [98.1 F (36.7 C)-99.1 F (37.3 C)] 98.1 F (36.7 C) (10/27 0500) Pulse Rate:  [59-68] 67  (10/27 0500) Resp:  [14-19] 16  (10/27 0813) BP: (138-167)/(70-77) 138/71 mmHg (10/27 0500) SpO2:  [96 %-99 %] 98 % (10/27 0813) Last BM Date: 05/14/12  Intake/Output from previous day: 10/26 0701 - 10/27 0700 In: 1617.5 [I.V.:1552.5; NG/GT:15; IV Piggyback:50] Out: 3031 [Urine:2100; Emesis/NG output:900; Drains:31] Intake/Output this shift:    General appearance: alert, cooperative and no distress GI: soft, mildly tender over insicion, drain with serous op, dressings dry NG with bilious output  Lab Results:   [facility] 05/18/12 0444  WBC 9.9  HGB 10.7*  HCT 31.2*  PLT 310   BMET  Basename 05/18/12 0444  NA 135  K 3.8  CL 99  CO2 28  GLUCOSE 118*  BUN 5*  CREATININE 0.54  CALCIUM 9.4    Assessment/Plan: 1. POD#3: exploratory laparotomy with lysis of adhesions, resection of duodenal diverticulum and EGD: doing well overall, no bowel function yet  --continue pulm toilet, oob  --Cont ng tube, jp drain, npo  --Heparin, scds for DVT prophylaxis  --Cont dressing changes    LOS: 4 days    [name] [name] C. 05/18/2012

## 2012-05-19 ENCOUNTER — Inpatient Hospital Stay (HOSPITAL_COMMUNITY): Payer: Managed Care, Other (non HMO)

## 2012-05-19 MED ORDER — DIPHENHYDRAMINE HCL 12.5 MG/5ML PO ELIX: 12.5000 mg | ORAL_SOLUTION | Freq: Four times a day (QID) | ORAL | Status: DC | PRN

## 2012-05-19 MED ORDER — DIPHENHYDRAMINE HCL 50 MG/ML IJ SOLN: 12.5000 mg | Freq: Four times a day (QID) | INTRAMUSCULAR | Status: DC | PRN

## 2012-05-19 MED ORDER — MORPHINE SULFATE (PF) 1 MG/ML IV SOLN
INTRAVENOUS | Status: DC
  Administered 2012-05-19: 1 mg via INTRAVENOUS
  Administered 2012-05-20: 3 mg via INTRAVENOUS
  Administered 2012-05-20: 2 mg via INTRAVENOUS
  Administered 2012-05-20: 16:00:00 via INTRAVENOUS
  Administered 2012-05-20 – 2012-05-21 (×4): 1 mg via INTRAVENOUS
  Filled 2012-05-19: qty 25

## 2012-05-19 NOTE — Progress Notes (Signed)
Patient ID: [name] [name], female   DOB: Jun 07, 1956, 56 y.o.   MRN: [id number] 5 Days Post-Op  Subjective: Doing well but really wants to eat, very frustrated, denies significant pain, n/v.  Lots of movement in abd but very little flatus  Objective: Vital signs in last 24 hours: Temp:  [97.7 F (36.5 C)-99 F (37.2 C)] 99 F (37.2 C) (10/28 0515) Pulse Rate:  [59-66] 59  (10/28 0515) Resp:  [14-20] 14  (10/28 0750) BP: (141-165)/(64-87) 141/64 mmHg (10/28 0515) SpO2:  [95 %-100 %] 96 % (10/28 0750) Last BM Date: 05/14/12  Intake/Output from previous day: 10/27 0701 - 10/28 0700 In: 2222.5 [I.V.:2057.5; NG/GT:15; IV Piggyback:150] Out: 2430 [Urine:1600; Emesis/NG output:800; Drains:30] Intake/Output this shift: Total I/O In: -  Out: 200 [Urine:200]  General appearance: alert, cooperative and no distress GI: soft, mildly tender over insicion, drain with serous op, dressings dry NG with bilious output  Lab Results:   [facility] 05/18/12 0444  WBC 9.9  HGB 10.7*  HCT 31.2*  PLT 310   BMET  Basename 05/18/12 0444  NA 135  K 3.8  CL 99  CO2 28  GLUCOSE 118*  BUN 5*  CREATININE 0.54  CALCIUM 9.4    Assessment/Plan: 1. POD#5: exploratory laparotomy with lysis of adhesions, resection of duodenal diverticulum and EGD: doing well overall, very little bowel function yet  --continue pulm toilet, oob  --check abd films today  --pending results of films, ?try clamping trial of NGT and try sips of clears  --Cont jp drain  --Heparin, scds for DVT prophylaxis  --Cont dressing changes    LOS: 5 days    [name] [name] 05/19/2012   Abdomen still distended.  Awaiting return of bowel function.  NG to suction.  Likely postop ileus.

## 2012-05-19 NOTE — Progress Notes (Signed)
Patient requested to have dressing changed in the am. [name] Goynes RN

## 2012-05-20 ENCOUNTER — Inpatient Hospital Stay (HOSPITAL_COMMUNITY): Payer: Managed Care, Other (non HMO)

## 2012-05-20 MED ORDER — IOHEXOL 300 MG/ML  SOLN: 100.0000 mL | Freq: Once | INTRAMUSCULAR | Status: AC | PRN

## 2012-05-20 NOTE — Progress Notes (Signed)
Afebrile. Vitals ok. +flatus.  Soft, min TTP, nondistened.  NG tube - 300cc dark green so far today  UGI reviewed. No leak  Postop ileus Cont ng tube to suction. Hopefully can start clamping trials in am if output remains low Ice chips for comfort.   Mary Sella. Andrey Campanile, MD, FACS General, Bariatric, & Minimally Invasive Surgery [facility], [place]

## 2012-05-20 NOTE — Progress Notes (Signed)
6 Days Post-Op  Subjective: Feels okay this morning offers no c/o at this time.  Objective: Vital signs in last 24 hours: Temp:  [98.4 F (36.9 C)-98.8 F (37.1 C)] 98.4 F (36.9 C) (10/29 0512) Pulse Rate:  [57-60] 60  (10/29 0512) Resp:  [10-13] 13  (10/29 0836) BP: (131-136)/(70-77) 131/77 mmHg (10/29 0512) SpO2:  [10 %-100 %] 99 % (10/29 0836) Last BM Date: 05/14/12  Intake/Output from previous day: 10/28 0701 - 10/29 0700 In: 1887.5 [I.V.:1857.5; NG/GT:30] Out: 2125 [Urine:1900; Emesis/NG output:200; Drains:25] Intake/Output this shift: Total I/O In: 0  Out: 200 [Urine:200]  General appearance: alert, cooperative, appears stated age, cachectic and no distress Chest: CTA bilaterally Cardiac: RRR Abdomen: dressing C/D/I +BS + flatus NG ( output bilious) no distention appropriately tender, pain well managed with PCA. Drain (25ml) serous. VSS, afebrile  Lab Results:   [facility] 05/18/12 0444  WBC 9.9  HGB 10.7*  HCT 31.2*  PLT 310   BMET  Basename 05/18/12 0444  NA 135  K 3.8  CL 99  CO2 28  GLUCOSE 118*  BUN 5*  CREATININE 0.54  CALCIUM 9.4   PT/INR No results found for this basename: LABPROT:2,INR:2 in the last 72 hours ABG No results found for this basename: PHART:2,PCO2:2,PO2:2,HCO3:2 in the last 72 hours  Studies/Results: Dg Abd Portable 1v  05/19/2012  *RADIOLOGY REPORT*  Clinical Data: Abdominal pain  PORTABLE ABDOMEN - 1 VIEW  Comparison: 05/14/2012  Findings: Nasogastric catheter and surgical drain are noted in place.  Contrast material is noted within the colon from recent CT. No free air is noted.  IMPRESSION: Postoperative changes without acute abnormality.   Original Report Authenticated By: Phillips Odor, M.D.     Anti-infectives: Anti-infectives     Start     Dose/Rate Route Frequency Ordered Stop   05/14/12 2359   piperacillin-tazobactam (ZOSYN) IVPB 3.375 g        3.375 g 12.5 mL/hr over 240 Minutes Intravenous Every 8 hours  05/14/12 2324     05/14/12 1900   ertapenem (INVANZ) 1 g in sodium chloride 0.9 % 50 mL IVPB        1 g 100 mL/hr over 30 Minutes Intravenous  Once 05/14/12 1855 05/14/12 1930          Assessment/Plan: There is no problem list on file for this patient. s/p Procedure(s) (LRB) with comments: EXPLORATORY LAPAROTOMY (N/A) - egd, lysis of adhesions, resection of duodenal diverticulm  Plan:  1. Continue NPO 2. continue pulm toilet, oob  3. Will try clamping trial of NGT and sips of clears.  Depending on report of UGI and Abdominal films later today. 4.Continue jp drain  5. ContinueIV abx for now. 6. Continue Heparin, scds for DVT prophylaxis  7.Cont dressing changes    LOS: 6 days    Golda Acre [facility] Pager # [phone] 05/20/2012

## 2012-05-21 MED ORDER — HYDROCODONE-ACETAMINOPHEN 5-325 MG PO TABS
1.0000 | ORAL_TABLET | ORAL | Status: DC | PRN
  Administered 2012-05-21 – 2012-05-22 (×5): 2 via ORAL
  Filled 2012-05-21 (×5): qty 2

## 2012-05-21 NOTE — Progress Notes (Signed)
Patient ID: [name] [name], female   DOB: [birth year], 56 y.o.   MRN: [id number] 7 Days Post-Op  Subjective: Feels okay this morning offers no c/o at this time. States that ashe is passing Flatus and has a BM.  Objective: Vital signs in last 24 hours: Temp:  [98.3 F (36.8 C)-98.6 F (37 C)] 98.6 F (37 C) (10/30 0605) Pulse Rate:  [52-66] 65  (10/30 0605) Resp:  [10-20] 10  (10/30 0800) BP: (129-146)/(64-71) 133/70 mmHg (10/30 0605) SpO2:  [96 %-100 %] 98 % (10/30 0800) Last BM Date: 05/21/12 (small stool passed with flatus)  Intake/Output from previous day: 06-15-23 0701 - 10/30 0700 In: 1845 [I.V.:1785; NG/GT:60] Out: 3920 [Urine:2350; Emesis/NG output:1500; Drains:70] Intake/Output this shift: Total I/O In: -  Out: 361 [Urine:250; Emesis/NG output:100; Drains:10; Stool:1]  General appearance: alert, cooperative, appears stated age, cachectic and no distress Chest: CTA bilaterally Cardiac: RRR Abdomen: dressing C/D/I +BS + flatus NG ( output bilious) no distention appropriately tender, pain well managed with PCA. Drain (10ml) serous. VSS, afebrile  Lab Results:  No results found for this basename: WBC:2,HGB:2,HCT:2,PLT:2 in the last 72 hours BMET No results found for this basename: NA:2,K:2,CL:2,CO2:2,GLUCOSE:2,BUN:2,CREATININE:2,CALCIUM:2 in the last 72 hours PT/INR No results found for this basename: LABPROT:2,INR:2 in the last 72 hours ABG No results found for this basename: PHART:2,PCO2:2,PO2:2,HCO3:2 in the last 72 hours  Studies/Results: Dg Abd 2 Views  06-14-12  *RADIOLOGY REPORT*  Clinical Data: Abdominal distention, post lysis of adhesions  ABDOMEN - 2 VIEW  Comparison: Portable abdomen film of 05/19/2012  Findings: Views of the abdomen show no bowel obstruction.  Some contrast is noted in the right colon and distal small bowel.  An NG tube is present with the tip in the distal body of stomach and an additional tube overlies the medial right upper quadrant.   No free air is seen.  There is right basilar atelectasis present and a probable nipple shadow is noted in the right lung base. In review of the recent CT of the abdomen pelvis, no abnormality is seen at the right lung base on the lung window images.  IMPRESSION:  1.  No bowel obstruction.  Some residual contrast is noted in the right colon and distal small bowel. 2.  No free air. 3.  Atelectasis at the right lung base.  Probable right nipple shadow.   Original Report Authenticated By: Juline Patch, M.D.    Dg Abd Portable 1v  05/19/2012  *RADIOLOGY REPORT*  Clinical Data: Abdominal pain  PORTABLE ABDOMEN - 1 VIEW  Comparison: 05/14/2012  Findings: Nasogastric catheter and surgical drain are noted in place.  Contrast material is noted within the colon from recent CT. No free air is noted.  IMPRESSION: Postoperative changes without acute abnormality.   Original Report Authenticated By: Phillips Odor, M.D.    Varney Biles [name] W/water Sol Cm  06-14-2012  *RADIOLOGY REPORT*  Clinical Data:  Duodenal perforation with repair  UPPER GI SERIES WITH KUB  Technique:  Routine upper GI series was performed with water soluble contrast.  Fluoroscopy Time: 2.4 minutes  Comparison:  CT abdomen pelvis dated 05/14/2012  Findings: Scout radiograph demonstrates a nasogastric tube in the distal gastric body and a surgical drain in the right mid abdomen.  Residual contrast (from prior CT) is present within loops of bowel in the right lower abdomen.  Lower abdominal skin staples.  Following contrast administration, the dominant duodenal diverticulum opacifies without definite extraluminal spillage of contrast.  Two additional diverticuli  are suspected on later images (series 14/image 1).  At no time was free spillage of contrast demonstrated.  The surgical drain remains unopacified.  IMPRESSION: No extraluminal contrast is demonstrated to suggest leak/residual perforation.  Dominant duodenal diverticulum with two additional suspected  diverticuli.  Given the complexity of the case/anatomy, consider a follow-up CT abdomen pelvis with oral contrast.   Original Report Authenticated By: Charline Bills, M.D.     Anti-infectives: Anti-infectives     Start     Dose/Rate Route Frequency Ordered Stop   05/14/12 2359   piperacillin-tazobactam (ZOSYN) IVPB 3.375 g        3.375 g 12.5 mL/hr over 240 Minutes Intravenous Every 8 hours 05/14/12 2324     05/14/12 1900   ertapenem (INVANZ) 1 g in sodium chloride 0.9 % 50 mL IVPB        1 g 100 mL/hr over 30 Minutes Intravenous  Once 05/14/12 1855 05/14/12 1930          Assessment/Plan: There is no problem list on file for this patient. s/p Procedure(s) (LRB) with comments: EXPLORATORY LAPAROTOMY (N/A) - egd, lysis of adhesions, resection of duodenal diverticulm  Plan:  1. Continue NPO 2. continue pulm toilet, oob  3. Will try clamping trial of NGT. 4.Continue jp drain  5. Continue IV abx for now. 6. Continue Heparin, scds for DVT prophylaxis  7.Cont dressing changes    LOS: 7 days    Golda Acre [facility] Pager # [phone]  05/21/2012  DOing okay with NG out.  Advance diet as tolerated.

## 2012-05-21 NOTE — Progress Notes (Signed)
ANTIBIOTIC CONSULT NOTE - FOLLOW UP  Pharmacy Consult for Zosyn Indication: Resection of duodenal diverticulum  Allergies  Allergen Reactions  . Sulfur Other (See Comments)    Makes her extremely hyper    Patient Measurements: Height: 5\' 5"  (165.1 cm) Weight: 117 lb 8.1 oz (53.3 kg) IBW/kg (Calculated) : 57   Vital Signs: Temp: 98.6 F (37 C) (10/30 0605) Temp src: Oral (10/30 0605) BP: 133/70 mmHg (10/30 0605) Pulse Rate: 65  (10/30 0605) Intake/Output from previous day: 10/29 0701 - 10/30 0700 In: 1845 [I.V.:1785; NG/GT:60] Out: 3920 [Urine:2350; Emesis/NG output:1500; Drains:70] Intake/Output from this shift: Total I/O In: 0  Out: 461 [Urine:350; Emesis/NG output:100; Drains:10; Stool:1]  Labs: No results found for this basename: WBC:3,HGB:3,PLT:3,LABCREA:3,CREATININE:3, in the last 72 hours Estimated Creatinine Clearance: 64.5 ml/min (by C-G formula based on Cr of 0.54).  Microbiology: No results found for this or any previous visit (from the past 720 hour(s)).  Anti-infectives     Start     Dose/Rate Route Frequency Ordered Stop   05/14/12 2359   piperacillin-tazobactam (ZOSYN) IVPB 3.375 g        3.375 g 12.5 mL/hr over 240 Minutes Intravenous Every 8 hours 05/14/12 2324     05/14/12 1900   ertapenem (INVANZ) 1 g in sodium chloride 0.9 % 50 mL IVPB        1 g 100 mL/hr over 30 Minutes Intravenous  Once 05/14/12 1855 05/14/12 1930          Assessment:  56 YOF s/p resection of perforated duodenal diverticulum on 10/23  Day #7 Zosyn 3.375gm IV q8h  Afebirle, WBC elevated but improving and renal function stable on 10/27  Goal of Therapy:  Appropriate dosing for renal function  Plan:   Continue Zosyn 3.375gm IV q8h (4hr extended infusions)  Follow renal function  Follow up planned duration of therapy  Loralee Pacas, PharmD, BCPS Pager: [phone] 05/21/2012,11:03 AM

## 2012-05-22 LAB — BASIC METABOLIC PANEL
BUN: 5 mg/dL — ABNORMAL LOW (ref 6–23)
CO2: 29 mEq/L (ref 19–32)
Calcium: 10.1 mg/dL (ref 8.4–10.5)
Creatinine, Ser: 0.67 mg/dL (ref 0.50–1.10)
GFR calc non Af Amer: >90 mL/min (ref >90)
Glucose, Bld: 101 mg/dL — ABNORMAL HIGH (ref 70–99)
Sodium: 135 mEq/L (ref 135–145)

## 2012-05-22 MED ORDER — METOPROLOL TARTRATE 25 MG PO TABS
25.0000 mg | ORAL_TABLET | Freq: Two times a day (BID) | ORAL | Status: DC
  Administered 2012-05-22 – 2012-05-23 (×3): 25 mg via ORAL
  Filled 2012-05-22 (×4): qty 1

## 2012-05-22 MED ORDER — PANTOPRAZOLE SODIUM 40 MG PO TBEC
40.0000 mg | DELAYED_RELEASE_TABLET | Freq: Every day | ORAL | Status: DC
  Administered 2012-05-22 – 2012-05-23 (×2): 40 mg via ORAL
  Filled 2012-05-22 (×2): qty 1

## 2012-05-22 NOTE — Progress Notes (Signed)
This patient is receiving IV Protonix. Based on criteria approved by the Pharmacy and Therapeutics Committee, this medication is being converted to the equivalent oral dose form. These criteria include:   . The patient is eating (either orally or per tube) and/or has been taking other orally administered medications for at least 24 hours.  . This patient has no evidence of active gastrointestinal bleeding or impaired GI absorption (gastrectomy, short bowel, patient on TNA or NPO).   If you have questions about this conversion, please contact the pharmacy department.  Berkley Harvey, [facility] 05/22/2012 10:28 AM

## 2012-05-22 NOTE — Progress Notes (Signed)
Patient ID: [name] [name], female   DOB: 20-Mar-1956, 56 y.o.   MRN: [id number] Patient ID: [name] [name], female   DOB: 03/19/1956, 56 y.o.   MRN: [id number] 8 Days Post-Op  Subjective: Feels okay this morning offers no c/o at this time. + BM,BS flatus tolerated diet yesterday.  Objective: Vital signs in last 24 hours: Temp:  [98.3 F (36.8 C)-98.9 F (37.2 C)] 98.3 F (36.8 C) (10/31 0559) Pulse Rate:  [56-58] 58  (10/31 0559) Resp:  [13-16] 16  (10/31 0559) BP: (110-138)/(64-79) 136/79 mmHg (10/31 0559) SpO2:  [95 %-98 %] 95 % (10/31 0559) Last BM Date: 05/22/12   General appearance: alert, cooperative, appears stated age, cachectic and no distress Chest: CTA bilaterally Cardiac: RRR Abdomen: dressing C/D/I drain (30ml) serous. Non tender, abdominal wound appears to be healing majority of staples have been removed, replaced with steri strips. No drng or erythema. VSS, afebrile  Lab Results:  No results found for this basename: WBC:2,HGB:2,HCT:2,PLT:2 in the last 72 hours BMET  [facility] 05/22/12 0427  NA 135  K 4.0  CL 99  CO2 29  GLUCOSE 101*  BUN 5*  CREATININE 0.67  CALCIUM 10.1   PT/INR No results found for this basename: LABPROT:2,INR:2 in the last 72 hours ABG No results found for this basename: PHART:2,PCO2:2,PO2:2,HCO3:2 in the last 72 hours  Studies/Results: Dg Abd 2 Views  05-21-2012  *RADIOLOGY REPORT*  Clinical Data: Abdominal distention, post lysis of adhesions  ABDOMEN - 2 VIEW  Comparison: Portable abdomen film of 05/19/2012  Findings: Views of the abdomen show no bowel obstruction.  Some contrast is noted in the right colon and distal small bowel.  An NG tube is present with the tip in the distal body of stomach and an additional tube overlies the medial right upper quadrant.  No free air is seen.  There is right basilar atelectasis present and a probable nipple shadow is noted in the right lung base. In review of the recent CT of the abdomen pelvis,  no abnormality is seen at the right lung base on the lung window images.  IMPRESSION:  1.  No bowel obstruction.  Some residual contrast is noted in the right colon and distal small bowel. 2.  No free air. 3.  Atelectasis at the right lung base.  Probable right nipple shadow.   Original Report Authenticated By: Juline Patch, M.D.    Dg [name] [facility] Sol Cm  05/21/2012  *RADIOLOGY REPORT*  Clinical Data:  Duodenal perforation with repair  UPPER GI SERIES WITH KUB  Technique:  Routine upper GI series was performed with water soluble contrast.  Fluoroscopy Time: 2.4 minutes  Comparison:  CT abdomen pelvis dated 05/14/2012  Findings: Scout radiograph demonstrates a nasogastric tube in the distal gastric body and a surgical drain in the right mid abdomen.  Residual contrast (from prior CT) is present within loops of bowel in the right lower abdomen.  Lower abdominal skin staples.  Following contrast administration, the dominant duodenal diverticulum opacifies without definite extraluminal spillage of contrast.  Two additional diverticuli are suspected on later images (series 14/image 1).  At no time was free spillage of contrast demonstrated.  The surgical drain remains unopacified.  IMPRESSION: No extraluminal contrast is demonstrated to suggest leak/residual perforation.  Dominant duodenal diverticulum with two additional suspected diverticuli.  Given the complexity of the case/anatomy, consider a follow-up CT abdomen pelvis with oral contrast.   Original Report Authenticated By: Charline Bills, M.D.  Anti-infectives: Anti-infectives     Start     Dose/Rate Route Frequency Ordered Stop   05/14/12 2359   piperacillin-tazobactam (ZOSYN) IVPB 3.375 g        3.375 g 12.5 mL/hr over 240 Minutes Intravenous Every 8 hours 05/14/12 2324     05/14/12 1900   ertapenem (INVANZ) 1 g in sodium chloride 0.9 % 50 mL IVPB        1 g 100 mL/hr over 30 Minutes Intravenous  Once 05/14/12 1855 05/14/12 1930            Assessment/Plan: There is no problem list on file for this patient. s/p Procedure(s) (LRB) with comments: EXPLORATORY LAPAROTOMY (N/A) - egd, lysis of adhesions, resection of duodenal diverticulm  Plan:  1. Advance diet, DC IVF increase PO intake. 2. continue pulm toilet, oob  3.DC jp drain (discussed with Dr. Dwain Sarna) 5. Continue IV abx for now. 6. Continue Heparin, scds for DVT prophylaxis  7.Cont dressing changes    LOS: 8 days    Golda Acre [facility] Pager # [phone]  05/22/2012  Tolerating diet.  Looks good.  Should be okay for discharge soon.

## 2012-05-23 MED ORDER — HYDROCODONE-ACETAMINOPHEN 5-325 MG PO TABS: 1.0000 | ORAL_TABLET | ORAL | Status: DC | PRN

## 2012-05-23 NOTE — Progress Notes (Signed)
Discharged home. Assessment unchanged. Patient given Rx for norco. States understanding of discharge instructions.

## 2012-05-23 NOTE — Progress Notes (Signed)
Patient ID: [name] [name], female   DOB: June 24, 1956, 56 y.o.   MRN: [id number] 9 days post op   Subjective: Feels good this morning offers no c/o at this time. + BM,BS flatus tolerating regular diet.  Objective: Vital signs in last 24 hours: Temp:  [98.7 F (37.1 C)-99 F (37.2 C)] 98.9 F (37.2 C) (11/01 0554) Pulse Rate:  [59-66] 63  (11/01 0554) Resp:  [18] 18  (11/01 0554) BP: (121-152)/(58-81) 152/76 mmHg (11/01 0554) SpO2:  [96 %-98 %] 98 % (11/01 0554) Last BM Date: 05/22/12   General appearance: alert, cooperative, appears stated age, cachectic and no distress Chest: CTA bilaterally Cardiac: RRR Abdomen: dressing C/D/I  Non tender, abdominal wound C/D/I steri strips in place, majority of staples have been removed. No drng or erythema. VSS, afebrile  Lab Results:  No results found for this basename: WBC:2,HGB:2,HCT:2,PLT:2 in the last 72 hours BMET  [facility] 05/22/12 0427  NA 135  K 4.0  CL 99  CO2 29  GLUCOSE 101*  BUN 5*  CREATININE 0.67  CALCIUM 10.1   PT/INR No results found for this basename: LABPROT:2,INR:2 in the last 72 hours ABG No results found for this basename: PHART:2,PCO2:2,PO2:2,HCO3:2 in the last 72 hours  Studies/Results: No results found.  Anti-infectives: Anti-infectives     Start     Dose/Rate Route Frequency Ordered Stop   05/14/12 2359   piperacillin-tazobactam (ZOSYN) IVPB 3.375 g  Status:  Discontinued        3.375 g 12.5 mL/hr over 240 Minutes Intravenous Every 8 hours 05/14/12 2324 05/22/12 0915   05/14/12 1900   ertapenem (INVANZ) 1 g in sodium chloride 0.9 % 50 mL IVPB        1 g 100 mL/hr over 30 Minutes Intravenous  Once 05/14/12 1855 05/14/12 1930          Assessment/Plan: There is no problem list on file for this patient. s/p Procedure(s) (LRB) with comments: EXPLORATORY LAPAROTOMY (N/A) - egd, lysis of adhesions, resection of duodenal diverticulm  Plan:  1. Continue pulm toilet, oob  2.Cont dressing  changes 3. Probable discharge today   LOS: 9 days    Golda Acre [facility] Surgery Pager # [phone]  05/23/2012  Looks and feels well.  Tolerating regular diet.  Should be okay for discharge to home.

## 2012-05-23 NOTE — Discharge Summary (Signed)
Physician Discharge Summary  Patient ID: [name] [name] MRN: [id number] DOB/AGE: 11-13-1955 56 y.o.  Admit date: 05/14/2012 Discharge date: 05/23/2012  Admission Diagnoses: Abdominal pain  Discharge Diagnoses: s/p exploratory laparotomy with lysis of adhesions.   Active Problems:  * No active hospital problems. *    Discharged Condition: stable  Hospital Course: 56 yo wf with history of possible MI in 2008, HTN, smoking who has been undergoing evaluation for microscopic hematuria. She had cysto on Monday. Today she was awakened by onset of severe abdominal pain that she describes in her rlq and epigastrium. She has been nauseated and vomiting. She is having some diarrhea. Her back hurts. She only feels ok when lying in fetal position. Nothing was making better. She is anorectic. She has not been taking nsaids. CT of abdomen and pelvis done on 05/14/12: IMPRESSION:  Perforated duodenal with abscess causing local mass effect  suspected as detailed above. Based on these findings and clinical presentation the patient was taken to the operating room for laparoscopic repair of her duodenal ulcer on 05/14/12 by Dr. Dwain Sarna.  She has done well post operativly; tolerated transition to diet, has had return of bowel function and has remained hemodynamically stable.   At this time she is stable for discharge and will follow up with Dr.Wakefield as scheduled.  She has been given post op care instructions and pain meds as well as our office number should she need it prior to her f/u appointment.   Consults: None  Significant Diagnostic Studies: labs  Treatments: IV hydration, antibiotics, analgesia, anticoagulation, respiratory therapy and surgery.  Discharge Exam: Blood pressure 152/76, pulse 63, temperature 98.9 F (37.2 C), temperature source Oral, resp. rate 18, height 5\' 5"  (1.651 m), weight 117 lb 8.1 oz (53.3 kg), SpO2 98.00%. General appearance: alert, cooperative, appears stated age  and no distress.  Disposition: 01-Home or Self Care  Discharge Orders    Future Appointments: Provider: Department: Dept Phone: Center:   05/30/2012 10:10 AM [facility] [phone] [facility]     Future Orders Please Complete By Expires   Discharge patient      Comments:   Home/self care follow up with Dr. Biagio Quint as scheduled       Medication List     As of 05/23/2012  9:52 AM    TAKE these medications         ALPRAZolam 0.25 MG tablet   Commonly known as: XANAX   Take 0.25 mg by mouth at bedtime as needed. Anxiety      butalbital-acetaminophen-caffeine 50-325-40 MG per tablet   Commonly known as: FIORICET, ESGIC   Take 1 tablet by mouth 2 (two) times daily as needed. Migraine      HYDROcodone-acetaminophen 5-325 MG per tablet   Commonly known as: NORCO/VICODIN   Take 1-2 tablets by mouth every 4 (four) hours as needed.      metoprolol tartrate 25 MG tablet   Commonly known as: LOPRESSOR   Take 25 mg by mouth 2 (two) times daily.      nitroGLYCERIN 0.4 MG SL tablet   Commonly known as: NITROSTAT   Place 0.4 mg under the tongue every 5 (five) minutes as needed. For chest pain      simvastatin 20 MG tablet   Commonly known as: ZOCOR   Take 20 mg by mouth every evening.           Follow-up Information    Follow up with LAYTON,  BRIAN DAVID, DO. In 2 weeks. (Call our office as needed, or  if symptoms worsen)    Contact information:   [address] [place] [address] [phone]          Signed: Blenda Mounts [facility] Surgery Pager # [phone]  05/23/2012, 9:52 AM

## 2012-05-26 ENCOUNTER — Telehealth (INDEPENDENT_AMBULATORY_CARE_PROVIDER_SITE_OTHER): Payer: Self-pay | Admitting: General Surgery

## 2012-05-26 NOTE — Telephone Encounter (Signed)
Pt called to report (1) needs refill of pain meds and (2) stomach pain after eating---gas and/ or cramping.  Hydrocodone 5/325 mg, # 30 , 1-2 po Q 4-6 H prn pain, no refill called to [facility]:  [phone].  Suggested trying Pepcid AC or Prilosec to see if the symptoms abated.  She will try this and will call back as needed.

## 2012-06-04 ENCOUNTER — Encounter (INDEPENDENT_AMBULATORY_CARE_PROVIDER_SITE_OTHER): Payer: Self-pay | Admitting: General Surgery

## 2012-06-04 ENCOUNTER — Ambulatory Visit (INDEPENDENT_AMBULATORY_CARE_PROVIDER_SITE_OTHER): Payer: 59 | Admitting: General Surgery

## 2012-06-04 VITALS — BP 136/80 | HR 75 | Temp 97.6°F | Resp 18 | Ht 65.0 in | Wt 103.6 lb

## 2012-06-04 DIAGNOSIS — Z4889 Encounter for other specified surgical aftercare: Secondary | ICD-10-CM

## 2012-06-04 DIAGNOSIS — Z5189 Encounter for other specified aftercare: Secondary | ICD-10-CM

## 2012-06-04 NOTE — Progress Notes (Signed)
Subjective:     Patient ID: [name] [name], female   DOB: 1956/07/05, 56 y.o.   MRN: [id number]  HPI This patient follows up status post exploratory laparotomy and excision of duodenal diverticulum for perforation by Dr. Dwain Sarna.  She says that she has some persistent discomfort in her abdomen after eating food although she is keeping her food down and not having nausea or vomiting. She says that she has been eating smaller and more frequent meals and this has helped with her symptoms. She denied any fevers or chills. Her pathology was benign. She is having about 2 bowel movements per day and preoperatively she was moving her bowels about once per day. Her hospital course was complicated with postoperative ileus and this resolved after about one week. She denies any abdominal bloating  Review of Systems     Objective:   Physical Exam Her abdomen is soft and has no significant tenderness on exam. Her incision is healing well she has a small portion of granulation tissue in the upper two thirds of her incision but otherwise there is no sign of infection. Her abdomen is nondistended and no peritoneal signs.    Assessment:     Status post exploratory laparotomy and duodenal diverticulectomy She still has some abdominal pain and postprandial abdominal pain which sounds like it could be related to her procedure. She says that she is slowly improving and I do not see any evidence of any postoperative complications. I think that she should continue with the small frequent meals and I will refill her pain medications and we will see her back in about 2 weeks    Plan:     F/u in 2 weeks.

## 2012-06-16 ENCOUNTER — Ambulatory Visit
Admission: RE | Admit: 2012-06-16 | Discharge: 2012-06-16 | Disposition: A | Discharge status: Home / Self Care | Payer: No Typology Code available for payment source | Source: Ambulatory Visit | Attending: Internal Medicine | Admitting: Internal Medicine

## 2012-06-16 DIAGNOSIS — R921 Mammographic calcification found on diagnostic imaging of breast: Secondary | ICD-10-CM

## 2012-06-27 ENCOUNTER — Encounter (INDEPENDENT_AMBULATORY_CARE_PROVIDER_SITE_OTHER): Payer: Self-pay | Admitting: General Surgery

## 2012-06-27 ENCOUNTER — Ambulatory Visit (INDEPENDENT_AMBULATORY_CARE_PROVIDER_SITE_OTHER): Payer: 59 | Admitting: General Surgery

## 2012-06-27 VITALS — BP 106/60 | HR 64 | Temp 97.0°F | Resp 18 | Ht 65.0 in | Wt 101.2 lb

## 2012-06-27 DIAGNOSIS — Z09 Encounter for follow-up examination after completed treatment for conditions other than malignant neoplasm: Secondary | ICD-10-CM

## 2012-06-29 ENCOUNTER — Encounter (INDEPENDENT_AMBULATORY_CARE_PROVIDER_SITE_OTHER): Payer: Self-pay | Admitting: General Surgery

## 2012-06-29 NOTE — Progress Notes (Signed)
Subjective:     Patient ID: [name] [name], female   DOB: [birth year], 56 y.o.   MRN: [id number]  HPI 56 yof who is about 6 weeks postop from laparotomy for what ended up being a perforated duodenal diverticulum with perotonitis.  She was discharged home. She was seen by one of my partners previously.  Today she reports still early satiety, difficulty eating anything but small amounts, decreased appetite.  She does not have fevers.  She does not have nausea or vomiting now.  She is having bowel movements.  Review of Systems     Objective:   Physical Exam Abdomen soft, nontender, wound healed without infection    Assessment:     S/p resection duodenal diverticulum     Plan:     I think she needs this further evaluated and will send her for ugi asap.  Will follow up after that.  I also recommended for her to begin prilosec twice daily and she will get otc.

## 2012-07-01 ENCOUNTER — Ambulatory Visit (HOSPITAL_COMMUNITY)
Admission: RE | Admit: 2012-07-01 | Discharge: 2012-07-01 | Disposition: A | Discharge status: Home / Self Care | Payer: Managed Care, Other (non HMO) | Source: Ambulatory Visit | Attending: General Surgery | Admitting: General Surgery

## 2012-07-01 ENCOUNTER — Other Ambulatory Visit (INDEPENDENT_AMBULATORY_CARE_PROVIDER_SITE_OTHER): Payer: Self-pay | Admitting: General Surgery

## 2012-07-01 DIAGNOSIS — Z09 Encounter for follow-up examination after completed treatment for conditions other than malignant neoplasm: Secondary | ICD-10-CM

## 2012-07-01 DIAGNOSIS — R109 Unspecified abdominal pain: Secondary | ICD-10-CM | POA: Insufficient documentation

## 2012-07-01 MED ORDER — IOHEXOL 300 MG/ML  SOLN
50.0000 mL | Freq: Once | INTRAMUSCULAR | Status: AC | PRN
  Administered 2012-07-01: 50 mL via ORAL

## 2012-07-03 ENCOUNTER — Telehealth (INDEPENDENT_AMBULATORY_CARE_PROVIDER_SITE_OTHER): Payer: Self-pay | Admitting: General Surgery

## 2012-07-03 NOTE — Telephone Encounter (Signed)
Message copied by Liliana Cline on Thu Jul 03, 2012  4:16 PM ------      Message from: Matilde Sprang      Created: Thu Jul 03, 2012  4:09 PM      Regarding: returned call      Contact: [phone]       Pt woke from nap and found messaged to call and ask for you.  No note in Epic for me to act on in your behalf.  Sorry.  bp

## 2012-07-03 NOTE — Telephone Encounter (Signed)
"  Her ugi is normal. If she is still feeling bad may need her to see gi. I asked her to start bid prilosec and to eat small frequent meals. Take ensure as well. If that is not working by early next week will need to see gi." Message from Dr Dwain Sarna. Patient made aware of normal UGI. She is doing better. She is eating small meals. She will start her prilosec twice a day and let us know if things get worse or no better.

## 2012-07-03 NOTE — Telephone Encounter (Signed)
LMOM asking pt to return my call and ask for myself or Jade.

## 2012-07-11 ENCOUNTER — Telehealth (INDEPENDENT_AMBULATORY_CARE_PROVIDER_SITE_OTHER): Payer: Self-pay | Admitting: General Surgery

## 2012-07-11 ENCOUNTER — Other Ambulatory Visit (INDEPENDENT_AMBULATORY_CARE_PROVIDER_SITE_OTHER): Payer: Self-pay | Admitting: General Surgery

## 2012-07-11 DIAGNOSIS — Z9889 Other specified postprocedural states: Secondary | ICD-10-CM

## 2012-07-11 NOTE — Telephone Encounter (Signed)
Pt of Dr. Dwain Sarna called to relate she still has a sharp pain in the LUQ, "like a knife sticking in her."  She is taking Prilosec BID as ordered.  Adding foods OK; denies gas.  Pain can be debilitating and she is quite frustrated to know cause of it all.

## 2012-07-11 NOTE — Telephone Encounter (Signed)
Called pt back and explained that I have put in an order for her to see the GI doctor since she is still having problems according to the note I received earlier.  She explained to me that she did not want to see the GI doctor because this is a different kind of pain and that she is actually having an easier time with eating since she started the prilosec.  She explained the new pain as being something that happened after driving and it feels like she "pulled something" .  I advised her to use some ibuprofen and a heating pad to help alleviate this pain and explained that if something hurts, then not do it.  She said she would call us back on Monday if things had gotten worse.

## 2012-07-14 ENCOUNTER — Telehealth (INDEPENDENT_AMBULATORY_CARE_PROVIDER_SITE_OTHER): Payer: Self-pay | Admitting: General Surgery

## 2012-07-14 NOTE — Telephone Encounter (Signed)
Called pt with response from Dr Dwain Sarna:  Will call in Hydrocodone 5/325 mg,  # 30, 1-2 po Q4-6H prn pain, no refill to [facility]:  [phone].  Pt also informed that Dr. Dwain Sarna really wants her to have the GI referral; she agreed to go if scheduled well into January to let her get her mother settled.

## 2012-07-14 NOTE — Telephone Encounter (Signed)
Spoke with pt and gave her the GI appt information being on 1/20 at 2:00.  I also mailed this information to her.

## 2012-07-14 NOTE — Telephone Encounter (Signed)
Pt called again to report pain in her side.  Denies it is GI in nature. She is caring for mother in the hospital and says she cannot take the time to rest and apply heat to it.  When asked directly, "what do you want [[facility]] to do for you?" (since she has declined referral to GI, etc.)  Pt then asked for additional Vicodin.  Please advise.

## 2013-01-16 ENCOUNTER — Telehealth (HOSPITAL_COMMUNITY): Payer: Self-pay | Admitting: *Deleted

## 2013-01-16 NOTE — Telephone Encounter (Signed)
Telephoned patient at home # and advised patient [facility] card had expired and was [facility]. Patient states now has [employer] and will call to schedule and appointment.

## 2013-10-22 ENCOUNTER — Other Ambulatory Visit: Payer: Self-pay | Admitting: Urology

## 2013-10-22 DIAGNOSIS — D3500 Benign neoplasm of unspecified adrenal gland: Secondary | ICD-10-CM

## 2013-11-10 ENCOUNTER — Ambulatory Visit (HOSPITAL_COMMUNITY)
Admission: RE | Admit: 2013-11-10 | Discharge: 2013-11-10 | Disposition: A | Discharge status: Home / Self Care | Payer: BC Managed Care – PPO | Source: Ambulatory Visit | Attending: Urology | Admitting: Urology

## 2013-12-08 ENCOUNTER — Ambulatory Visit (HOSPITAL_COMMUNITY)
Admission: RE | Admit: 2013-12-08 | Discharge: 2013-12-08 | Disposition: A | Discharge status: Home / Self Care | Payer: BC Managed Care – PPO | Source: Ambulatory Visit | Attending: Urology | Admitting: Urology

## 2013-12-08 DIAGNOSIS — I10 Essential (primary) hypertension: Secondary | ICD-10-CM | POA: Insufficient documentation

## 2013-12-08 DIAGNOSIS — D3500 Benign neoplasm of unspecified adrenal gland: Secondary | ICD-10-CM | POA: Insufficient documentation

## 2013-12-08 DIAGNOSIS — E785 Hyperlipidemia, unspecified: Secondary | ICD-10-CM | POA: Insufficient documentation

## 2013-12-08 DIAGNOSIS — Z85828 Personal history of other malignant neoplasm of skin: Secondary | ICD-10-CM | POA: Insufficient documentation

## 2013-12-08 LAB — POCT I-STAT CREATININE: CREATININE: 0.8 mg/dL (ref 0.50–1.10)

## 2013-12-08 MED ORDER — GADOBENATE DIMEGLUMINE 529 MG/ML IV SOLN
10.0000 mL | Freq: Once | INTRAVENOUS | Status: AC | PRN
  Administered 2013-12-08: 10 mL via INTRAVENOUS

## 2013-12-09 ENCOUNTER — Ambulatory Visit (HOSPITAL_COMMUNITY): Admission: RE | Admit: 2013-12-09 | Payer: BC Managed Care – PPO | Source: Ambulatory Visit

## 2013-12-15 ENCOUNTER — Inpatient Hospital Stay (HOSPITAL_COMMUNITY): Admission: RE | Admit: 2013-12-15 | Payer: BC Managed Care – PPO | Source: Ambulatory Visit

## 2014-04-01 ENCOUNTER — Ambulatory Visit
Admission: RE | Admit: 2014-04-01 | Discharge: 2014-04-01 | Disposition: A | Discharge status: Home / Self Care | Payer: BC Managed Care – PPO | Source: Ambulatory Visit | Attending: Internal Medicine | Admitting: Internal Medicine

## 2014-04-01 ENCOUNTER — Other Ambulatory Visit: Payer: Self-pay | Admitting: Internal Medicine

## 2014-04-01 DIAGNOSIS — R05 Cough: Secondary | ICD-10-CM

## 2014-04-01 DIAGNOSIS — R059 Cough, unspecified: Secondary | ICD-10-CM

## 2014-04-03 ENCOUNTER — Emergency Department (HOSPITAL_COMMUNITY)
Admission: EM | Admit: 2014-04-03 | Discharge: 2014-04-03 | Disposition: A | Discharge status: Home / Self Care | Payer: BC Managed Care – PPO | Attending: Emergency Medicine | Admitting: Emergency Medicine

## 2014-04-03 ENCOUNTER — Encounter (HOSPITAL_COMMUNITY): Payer: Self-pay | Admitting: Emergency Medicine

## 2014-04-03 DIAGNOSIS — S1096XA Insect bite of unspecified part of neck, initial encounter: Secondary | ICD-10-CM | POA: Diagnosis not present

## 2014-04-03 DIAGNOSIS — S0993XA Unspecified injury of face, initial encounter: Secondary | ICD-10-CM | POA: Insufficient documentation

## 2014-04-03 DIAGNOSIS — Z79899 Other long term (current) drug therapy: Secondary | ICD-10-CM | POA: Diagnosis not present

## 2014-04-03 DIAGNOSIS — F17200 Nicotine dependence, unspecified, uncomplicated: Secondary | ICD-10-CM | POA: Insufficient documentation

## 2014-04-03 DIAGNOSIS — S199XXA Unspecified injury of neck, initial encounter: Secondary | ICD-10-CM

## 2014-04-03 DIAGNOSIS — I10 Essential (primary) hypertension: Secondary | ICD-10-CM | POA: Diagnosis not present

## 2014-04-03 DIAGNOSIS — Y9289 Other specified places as the place of occurrence of the external cause: Secondary | ICD-10-CM | POA: Diagnosis not present

## 2014-04-03 DIAGNOSIS — Y9389 Activity, other specified: Secondary | ICD-10-CM | POA: Insufficient documentation

## 2014-04-03 DIAGNOSIS — W57XXXA Bitten or stung by nonvenomous insect and other nonvenomous arthropods, initial encounter: Secondary | ICD-10-CM | POA: Diagnosis not present

## 2014-04-03 DIAGNOSIS — F411 Generalized anxiety disorder: Secondary | ICD-10-CM | POA: Diagnosis not present

## 2014-04-03 DIAGNOSIS — E785 Hyperlipidemia, unspecified: Secondary | ICD-10-CM | POA: Insufficient documentation

## 2014-04-03 DIAGNOSIS — Z85828 Personal history of other malignant neoplasm of skin: Secondary | ICD-10-CM | POA: Diagnosis not present

## 2014-04-03 MED ORDER — DOXYCYCLINE HYCLATE 100 MG PO CAPS: 100.0000 mg | ORAL_CAPSULE | Freq: Two times a day (BID) | ORAL | Status: DC

## 2014-04-03 NOTE — Discharge Instructions (Signed)

## 2014-04-03 NOTE — ED Provider Notes (Signed)
CSN: [id number]     Arrival date & time 04/03/14  2140 History  This chart was scribed for non-physician practitioner, Cleatrice Burke, PA-C working with Kalman Drape, MD, by Erling Conte, ED Scribe. This patient was seen in room TR10C/TR10C and the patient's care was started at 11:24 PM.    Chief Complaint  Patient presents with  . Insect Bite    The patient said something bit her this morning at about 0345hrs.  She says she has swelling now and that is why she is here.      The history is provided by the patient. No language interpreter was used.   HPI Comments: [name] [name] is a 58 y.o. female who presents to the Emergency Department complaining of an insect bite around 17 hours ago. She states it occurred around 3:45 AM. She believes it was a spider bite to her right cheek. Pt states there is some mild swelling, redness and "unusual sensation" in her cheek. She denies any pain to area except when she touches it. Pt states she is worried it could have been a brown recluse. She states she just wanted to have it checked out. She denies any fever, chills, nausea, vomiting, shortness of breath, or trouble swallowing.   Past Medical History  Diagnosis Date  . Hypertension   . Hyperlipidemia   . Skin cancer, basal cell   . Abnormal Pap smear 1982  . Mental disorder     anxiety   . Migraines   . Complication of anesthesia   . PONV (postoperative nausea and vomiting)    Past Surgical History  Procedure Laterality Date  . Partial hysterectomy    . Tonsillectomy    . Appendectomy    . Cosmetic surgery    . Reconstructive eye surgery    . Anal fissure repair    . Laparotomy  05/14/2012    Procedure: EXPLORATORY LAPAROTOMY;  Surgeon: Rolm Bookbinder, MD;  Location: [facility];  Service: General;  Laterality: N/A;  egd, lysis of adhesions, resection of duodenal diverticulm   Family History  Problem Relation Age of Onset  . Hypertension Mother   . Stroke Mother   . Heart disease  Mother    History  Substance Use Topics  . Smoking status: Current Every Day Smoker -- 0.50 packs/day for 20 years  . Smokeless tobacco: Never Used  . Alcohol Use: Yes     Comment: rarely   OB History   Grav Para Term Preterm Abortions TAB SAB Ect Mult Living   3 3 3       3      Review of Systems  Constitutional: Negative for fever and chills.  HENT: Positive for facial swelling (mild). Negative for trouble swallowing.   Respiratory: Negative for shortness of breath.   Gastrointestinal: Negative for nausea and vomiting.  Skin: Positive for color change (mild redness to area) and wound (spider bite to her right cheek). Negative for rash.  All other systems reviewed and are negative.     Allergies  Sulfur  Home Medications   Prior to Admission medications   Medication Sig Start Date End Date Taking? Authorizing Provider  ALPRAZolam (XANAX) 0.25 MG tablet Take 0.25 mg by mouth at bedtime as needed. Anxiety    Historical Provider, MD  butalbital-acetaminophen-caffeine (FIORICET, ESGIC) 50-325-40 MG per tablet Take 1 tablet by mouth 2 (two) times daily as needed. Migraine    Historical Provider, MD  famotidine (PEPCID AC) 10 MG chewable tablet Chew 10 mg  by mouth 2 (two) times daily.    Historical Provider, MD  HYDROcodone-acetaminophen (NORCO/VICODIN) 5-325 MG per tablet Take 1-2 tablets by mouth every 4 (four) hours as needed. 05/23/12   Robinette Haines, NP  metoprolol tartrate (LOPRESSOR) 25 MG tablet Take 25 mg by mouth 2 (two) times daily.    Historical Provider, MD  nitroGLYCERIN (NITROSTAT) 0.4 MG SL tablet Place 0.4 mg under the tongue every 5 (five) minutes as needed. For chest pain    Historical Provider, MD  simvastatin (ZOCOR) 20 MG tablet Take 20 mg by mouth every evening.    Historical Provider, MD  traZODone (DESYREL) 100 MG tablet Take 25 mg by mouth at bedtime.    Historical Provider, MD   Triage Vitals: BP 144/82  Pulse 66  Temp(Src) 98.6 F (37 C) (Oral)  Resp  15  Ht 5\' 5"  (1.651 m)  Wt 113 lb (51.256 kg)  BMI 18.80 kg/m2  SpO2 95%  Physical Exam  Nursing note and vitals reviewed. Constitutional: She is oriented to person, place, and time. She appears well-developed and well-nourished. No distress.  HENT:  Head: Normocephalic and atraumatic.  Right Ear: External ear normal.  Left Ear: External ear normal.  Nose: Nose normal.  Mouth/Throat: Oropharynx is clear and moist.  Eyes: Conjunctivae are normal.  Neck: Normal range of motion.  Cardiovascular: Normal rate, regular rhythm and normal heart sounds.   Pulmonary/Chest: Effort normal and breath sounds normal. No stridor. No respiratory distress. She has no wheezes. She has no rales.  Abdominal: Soft. She exhibits no distension.  Musculoskeletal: Normal range of motion.  Neurological: She is alert and oriented to person, place, and time. She has normal strength.  Skin: Skin is warm and dry. She is not diaphoretic. There is erythema.  1 cm erythematous area to right cheek. No fluctuance or induration. No streaking. No central clearing, necrotic center.   Psychiatric: She has a normal mood and affect. Her behavior is normal.    ED Course  Procedures (including critical care time)  DIAGNOSTIC STUDIES: Oxygen Saturation is 95% on RA, adequate by my interpretation.    COORDINATION OF CARE: 11:29 PM- Advised pt that if the bite gets worse, the redness continues to spread, or pt has any trouble swallowing to return to the ED. Will discharge pt with Doxycycline. Pt advised of plan for treatment and pt agrees.  Labs Review Labs Reviewed - No data to display  Imaging Review No results found.   EKG Interpretation None      MDM   Final diagnoses:  Insect bite   Patient presents to the emergency department for evaluation of insect bite to right cheek. No induration or fluctuance. No streaking. Will cover with doxycycline. Patient will watch bite to ensure it is not worsening. F/u with  PCP. Discussed reasons to return to ED immediately. Vital signs stable for discharge. Patient / Family / Caregiver informed of clinical course, understand medical decision-making process, and agree with plan.   I personally performed the services described in this documentation, which was scribed in my presence. The recorded information has been reviewed and is accurate.    Elwyn Lade, PA-C 04/04/14 [phone]

## 2014-04-03 NOTE — ED Notes (Signed)
The patient said something bit her this morning at about 0345hrs.  She says she has swelling now and that is why she is here.  She says her sister has looked at it and she told her to get it checked out because it might have been a brown recluse. The patient says she is more tired than usual and that is why she came in.  She says she is not in pain, it is just her cheek feels "weird".

## 2014-04-07 NOTE — ED Provider Notes (Signed)
Medical screening examination/treatment/procedure(s) were performed by non-physician practitioner and as supervising physician I was immediately available for consultation/collaboration.   EKG Interpretation None       Kalman Drape, MD 04/07/14 [phone]

## 2014-05-24 ENCOUNTER — Encounter (HOSPITAL_COMMUNITY): Payer: Self-pay | Admitting: Emergency Medicine

## 2014-07-19 ENCOUNTER — Encounter (HOSPITAL_BASED_OUTPATIENT_CLINIC_OR_DEPARTMENT_OTHER): Payer: Self-pay | Admitting: *Deleted

## 2014-07-19 NOTE — Progress Notes (Signed)
Had recent ekg-labs dr Mancel Bale- Hx chest pain-mi 08-saw dr Martinique and Crenshaw-last 2012-has not had to see cardiology since

## 2014-07-20 ENCOUNTER — Other Ambulatory Visit: Payer: Self-pay | Admitting: Orthopedic Surgery

## 2014-07-22 ENCOUNTER — Encounter (HOSPITAL_BASED_OUTPATIENT_CLINIC_OR_DEPARTMENT_OTHER): Payer: Self-pay

## 2014-07-22 ENCOUNTER — Encounter (HOSPITAL_BASED_OUTPATIENT_CLINIC_OR_DEPARTMENT_OTHER)
Admission: RE | Disposition: A | Discharge status: Home / Self Care | Payer: Self-pay | Source: Ambulatory Visit | Attending: Orthopedic Surgery

## 2014-07-22 ENCOUNTER — Ambulatory Visit (HOSPITAL_BASED_OUTPATIENT_CLINIC_OR_DEPARTMENT_OTHER): Payer: BC Managed Care – PPO | Admitting: Anesthesiology

## 2014-07-22 ENCOUNTER — Ambulatory Visit (HOSPITAL_BASED_OUTPATIENT_CLINIC_OR_DEPARTMENT_OTHER)
Admission: RE | Admit: 2014-07-22 | Discharge: 2014-07-22 | Disposition: A | Discharge status: Home / Self Care | Payer: BC Managed Care – PPO | Source: Ambulatory Visit | Attending: Orthopedic Surgery | Admitting: Orthopedic Surgery

## 2014-07-22 DIAGNOSIS — Z8249 Family history of ischemic heart disease and other diseases of the circulatory system: Secondary | ICD-10-CM | POA: Insufficient documentation

## 2014-07-22 DIAGNOSIS — Z882 Allergy status to sulfonamides status: Secondary | ICD-10-CM | POA: Diagnosis not present

## 2014-07-22 DIAGNOSIS — Z823 Family history of stroke: Secondary | ICD-10-CM | POA: Diagnosis not present

## 2014-07-22 DIAGNOSIS — E785 Hyperlipidemia, unspecified: Secondary | ICD-10-CM | POA: Diagnosis not present

## 2014-07-22 DIAGNOSIS — F17210 Nicotine dependence, cigarettes, uncomplicated: Secondary | ICD-10-CM | POA: Insufficient documentation

## 2014-07-22 DIAGNOSIS — F419 Anxiety disorder, unspecified: Secondary | ICD-10-CM | POA: Insufficient documentation

## 2014-07-22 DIAGNOSIS — K219 Gastro-esophageal reflux disease without esophagitis: Secondary | ICD-10-CM | POA: Diagnosis not present

## 2014-07-22 DIAGNOSIS — Z85828 Personal history of other malignant neoplasm of skin: Secondary | ICD-10-CM | POA: Insufficient documentation

## 2014-07-22 DIAGNOSIS — I10 Essential (primary) hypertension: Secondary | ICD-10-CM | POA: Insufficient documentation

## 2014-07-22 DIAGNOSIS — M65341 Trigger finger, right ring finger: Secondary | ICD-10-CM | POA: Insufficient documentation

## 2014-07-22 HISTORY — DX: Gastro-esophageal reflux disease without esophagitis: K21.9

## 2014-07-22 HISTORY — PX: TRIGGER FINGER RELEASE: SHX641

## 2014-07-22 LAB — POCT I-STAT, CHEM 8
BUN: 5 mg/dL — AB (ref 6–23)
CREATININE: 0.5 mg/dL (ref 0.50–1.10)
Calcium, Ion: 1.22 mmol/L (ref 1.13–1.30)
Chloride: 103 mEq/L (ref 96–112)
Glucose, Bld: 92 mg/dL (ref 70–99)
HCT: 44 % (ref 36.0–46.0)
Hemoglobin: 15 g/dL (ref 12.0–15.0)
Potassium: 4.1 mmol/L (ref 3.5–5.1)
SODIUM: 139 mmol/L (ref 135–145)
TCO2: 23 mmol/L (ref 0–100)

## 2014-07-22 SURGERY — RELEASE, A1 PULLEY, FOR TRIGGER FINGER
Anesthesia: General | Site: Finger | Laterality: Right

## 2014-07-22 MED ORDER — CEFAZOLIN SODIUM-DEXTROSE 2-3 GM-% IV SOLR
2.0000 g | INTRAVENOUS | Status: AC
  Administered 2014-07-22: 2 g via INTRAVENOUS

## 2014-07-22 MED ORDER — BUPIVACAINE HCL (PF) 0.25 % IJ SOLN
INTRAMUSCULAR | Status: DC | PRN
  Administered 2014-07-22: 3 mL

## 2014-07-22 MED ORDER — ONDANSETRON HCL 4 MG/2ML IJ SOLN
INTRAMUSCULAR | Status: DC | PRN
  Administered 2014-07-22: 4 mg via INTRAVENOUS

## 2014-07-22 MED ORDER — DEXAMETHASONE SODIUM PHOSPHATE 10 MG/ML IJ SOLN
INTRAMUSCULAR | Status: DC | PRN
  Administered 2014-07-22: 10 mg via INTRAVENOUS

## 2014-07-22 MED ORDER — ONDANSETRON HCL 4 MG/2ML IJ SOLN: 4.0000 mg | Freq: Four times a day (QID) | INTRAMUSCULAR | Status: DC | PRN

## 2014-07-22 MED ORDER — FENTANYL CITRATE 0.05 MG/ML IJ SOLN
INTRAMUSCULAR | Status: AC
  Filled 2014-07-22: qty 2

## 2014-07-22 MED ORDER — MIDAZOLAM HCL 5 MG/5ML IJ SOLN
INTRAMUSCULAR | Status: DC | PRN
  Administered 2014-07-22: 2 mg via INTRAVENOUS

## 2014-07-22 MED ORDER — FENTANYL CITRATE 0.05 MG/ML IJ SOLN
INTRAMUSCULAR | Status: DC | PRN
  Administered 2014-07-22: 100 ug via INTRAVENOUS

## 2014-07-22 MED ORDER — HYDROCODONE-ACETAMINOPHEN 5-325 MG PO TABS: ORAL_TABLET | ORAL | Status: DC

## 2014-07-22 MED ORDER — LACTATED RINGERS IV SOLN
INTRAVENOUS | Status: DC
  Administered 2014-07-22: 12:00:00 via INTRAVENOUS

## 2014-07-22 MED ORDER — LIDOCAINE HCL (CARDIAC) 20 MG/ML IV SOLN
INTRAVENOUS | Status: DC | PRN
  Administered 2014-07-22: 60 mg via INTRAVENOUS

## 2014-07-22 MED ORDER — MIDAZOLAM HCL 2 MG/2ML IJ SOLN: 1.0000 mg | INTRAMUSCULAR | Status: DC | PRN

## 2014-07-22 MED ORDER — PROPOFOL 10 MG/ML IV BOLUS
INTRAVENOUS | Status: DC | PRN
  Administered 2014-07-22: 160 mg via INTRAVENOUS

## 2014-07-22 MED ORDER — MIDAZOLAM HCL 2 MG/2ML IJ SOLN
INTRAMUSCULAR | Status: AC
  Filled 2014-07-22: qty 2

## 2014-07-22 MED ORDER — FENTANYL CITRATE 0.05 MG/ML IJ SOLN: 50.0000 ug | INTRAMUSCULAR | Status: DC | PRN

## 2014-07-22 MED ORDER — CEFAZOLIN SODIUM-DEXTROSE 2-3 GM-% IV SOLR
INTRAVENOUS | Status: AC
  Filled 2014-07-22: qty 50

## 2014-07-22 MED ORDER — CHLORHEXIDINE GLUCONATE 4 % EX LIQD: 60.0000 mL | Freq: Once | CUTANEOUS | Status: DC

## 2014-07-22 MED ORDER — FENTANYL CITRATE 0.05 MG/ML IJ SOLN: 25.0000 ug | INTRAMUSCULAR | Status: DC | PRN

## 2014-07-22 SURGICAL SUPPLY — 37 items
BANDAGE COBAN STERILE 2 (GAUZE/BANDAGES/DRESSINGS) ×2 IMPLANT
BLADE MINI RND TIP GREEN BEAV (BLADE) IMPLANT
BLADE SURG 15 STRL LF DISP TIS (BLADE) ×2 IMPLANT
BLADE SURG 15 STRL SS (BLADE) ×4
BNDG CMPR 9X4 STRL LF SNTH (GAUZE/BANDAGES/DRESSINGS) ×1
BNDG CONFORM 2 STRL LF (GAUZE/BANDAGES/DRESSINGS) ×2 IMPLANT
BNDG ESMARK 4X9 LF (GAUZE/BANDAGES/DRESSINGS) ×1 IMPLANT
CHLORAPREP W/TINT 26ML (MISCELLANEOUS) ×2 IMPLANT
CORDS BIPOLAR (ELECTRODE) ×2 IMPLANT
COVER BACK TABLE 60X90IN (DRAPES) ×2 IMPLANT
COVER MAYO STAND STRL (DRAPES) ×2 IMPLANT
CUFF TOURNIQUET SINGLE 18IN (TOURNIQUET CUFF) ×2 IMPLANT
DRAPE EXTREMITY T 121X128X90 (DRAPE) ×2 IMPLANT
DRAPE SURG 17X23 STRL (DRAPES) ×2 IMPLANT
GAUZE SPONGE 4X4 12PLY STRL (GAUZE/BANDAGES/DRESSINGS) ×2 IMPLANT
GAUZE XEROFORM 1X8 LF (GAUZE/BANDAGES/DRESSINGS) ×2 IMPLANT
GLOVE BIO SURGEON STRL SZ 6.5 (GLOVE) ×1 IMPLANT
GLOVE BIO SURGEON STRL SZ7.5 (GLOVE) ×2 IMPLANT
GLOVE BIOGEL PI IND STRL 7.0 (GLOVE) IMPLANT
GLOVE BIOGEL PI IND STRL 8 (GLOVE) ×1 IMPLANT
GLOVE BIOGEL PI INDICATOR 7.0 (GLOVE) ×2
GLOVE BIOGEL PI INDICATOR 8 (GLOVE) ×1
GOWN STRL REUS W/ TWL LRG LVL3 (GOWN DISPOSABLE) ×1 IMPLANT
GOWN STRL REUS W/TWL LRG LVL3 (GOWN DISPOSABLE) ×2
GOWN STRL REUS W/TWL XL LVL3 (GOWN DISPOSABLE) ×2 IMPLANT
NDL HYPO 25X1 1.5 SAFETY (NEEDLE) IMPLANT
NEEDLE HYPO 25X1 1.5 SAFETY (NEEDLE) ×2 IMPLANT
NS IRRIG 1000ML POUR BTL (IV SOLUTION) ×2 IMPLANT
PACK BASIN DAY SURGERY FS (CUSTOM PROCEDURE TRAY) ×2 IMPLANT
PADDING CAST ABS 4INX4YD NS (CAST SUPPLIES) ×1
PADDING CAST ABS COTTON 4X4 ST (CAST SUPPLIES) ×1 IMPLANT
STOCKINETTE 4X48 STRL (DRAPES) ×2 IMPLANT
SUT ETHILON 4 0 PS 2 18 (SUTURE) ×2 IMPLANT
SYR BULB 3OZ (MISCELLANEOUS) ×2 IMPLANT
SYR CONTROL 10ML LL (SYRINGE) ×1 IMPLANT
TOWEL OR 17X24 6PK STRL BLUE (TOWEL DISPOSABLE) ×4 IMPLANT
UNDERPAD 30X30 INCONTINENT (UNDERPADS AND DIAPERS) ×2 IMPLANT

## 2014-07-22 NOTE — Brief Op Note (Signed)
07/22/2014  1:19 PM  PATIENT:  [name] [name]  58 y.o. female  PRE-OPERATIVE DIAGNOSIS:  right ring finger trigger digit  POST-OPERATIVE DIAGNOSIS:  right ring finger trigger  PROCEDURE:  Procedure(s): RIGHT RING FINGER TRIGGER RELEASE  (Right)  SURGEON:  Surgeon(s) and Role:    * Leanora Cover, MD - Primary  PHYSICIAN ASSISTANT:   ASSISTANTS: none   ANESTHESIA:   general  EBL:  Total I/O In: 300 [I.V.:300] Out: -   BLOOD ADMINISTERED:none  DRAINS: none   LOCAL MEDICATIONS USED:  MARCAINE     SPECIMEN:  No Specimen  DISPOSITION OF SPECIMEN:  N/A  COUNTS:  YES  TOURNIQUET:   Total Tourniquet Time Documented: Upper Arm (Right) - 9 minutes Total: Upper Arm (Right) - 9 minutes   DICTATION: .Other Dictation: Dictation Number [id number]  PLAN OF CARE: Discharge to home after PACU  PATIENT DISPOSITION:  PACU - hemodynamically stable.

## 2014-07-22 NOTE — Op Note (Signed)
[id number] 

## 2014-07-22 NOTE — Discharge Instructions (Addendum)

## 2014-07-22 NOTE — Anesthesia Preprocedure Evaluation (Signed)
Anesthesia Evaluation  Patient identified by MRN, date of birth, ID band Patient awake    Reviewed: Allergy & Precautions, H&P , NPO status , Patient's Chart, lab work & pertinent test results  History of Anesthesia Complications (+) PONV  Airway Mallampati: II   Neck ROM: full    Dental   Pulmonary Current Smoker,          Cardiovascular hypertension,     Neuro/Psych  Headaches,    GI/Hepatic GERD-  ,  Endo/Other    Renal/GU      Musculoskeletal   Abdominal   Peds  Hematology   Anesthesia Other Findings   Reproductive/Obstetrics                             Anesthesia Physical Anesthesia Plan  ASA: II  Anesthesia Plan: General   Post-op Pain Management:    Induction: Intravenous  Airway Management Planned: LMA  Additional Equipment:   Intra-op Plan:   Post-operative Plan:   Informed Consent: I have reviewed the patients History and Physical, chart, labs and discussed the procedure including the risks, benefits and alternatives for the proposed anesthesia with the patient or authorized representative who has indicated his/her understanding and acceptance.     Plan Discussed with: CRNA, Anesthesiologist and Surgeon  Anesthesia Plan Comments:         Anesthesia Quick Evaluation

## 2014-07-22 NOTE — Transfer of Care (Signed)
Immediate Anesthesia Transfer of Care Note  Patient: [name] [name]  Procedure(s) Performed: Procedure(s): RIGHT RING FINGER TRIGGER RELEASE  (Right)  Patient Location: PACU  Anesthesia Type:General  Level of Consciousness: awake, alert  and oriented  Airway & Oxygen Therapy: Patient Spontanous Breathing and Patient connected to face mask oxygen  Post-op Assessment: Report given to PACU RN and Post -op Vital signs reviewed and stable  Post vital signs: Reviewed and stable  Complications: No apparent anesthesia complications

## 2014-07-22 NOTE — Anesthesia Procedure Notes (Signed)
Procedure Name: LMA Insertion Date/Time: 07/22/2014 1:03 PM Performed by: Maryella Shivers Pre-anesthesia Checklist: Patient identified, Emergency Drugs available, Suction available and Patient being monitored Patient Re-evaluated:Patient Re-evaluated prior to inductionOxygen Delivery Method: Circle System Utilized Preoxygenation: Pre-oxygenation with 100% oxygen Intubation Type: IV induction Ventilation: Mask ventilation without difficulty LMA: LMA inserted LMA Size: 4.0 Number of attempts: 1 Airway Equipment and Method: bite block Placement Confirmation: positive ETCO2 Tube secured with: Tape Dental Injury: Teeth and Oropharynx as per pre-operative assessment

## 2014-07-22 NOTE — H&P (Signed)
  [name] [name] is an 58 y.o. female.   Chief Complaint: right ring finger trigger digit HPI: 58 yo rhd female with triggering of right ring finger x 3 months.  This has recurred after injection.  She wishes to have a trigger release for management of symptoms.    Past Medical History  Diagnosis Date  . Hypertension   . Hyperlipidemia   . Skin cancer, basal cell   . Abnormal Pap smear 1982  . Mental disorder     anxiety   . Migraines   . Complication of anesthesia   . PONV (postoperative nausea and vomiting)   . GERD (gastroesophageal reflux disease)     no meds    Past Surgical History  Procedure Laterality Date  . Partial hysterectomy    . Tonsillectomy    . Appendectomy    . Cosmetic surgery      facial-  . Reconstructive eye surgery    . Anal fissure repair    . Laparotomy  05/14/2012    Procedure: EXPLORATORY LAPAROTOMY;  Surgeon: Rolm Bookbinder, MD;  Location: [facility];  Service: General;  Laterality: N/A;  egd, lysis of adhesions, resection of duodenal diverticulm  . Abdominal hysterectomy      Family History  Problem Relation Age of Onset  . Hypertension Mother   . Stroke Mother   . Heart disease Mother    Social History:  reports that she has been smoking.  She has never used smokeless tobacco. She reports that she drinks alcohol. She reports that she does not use illicit drugs.  Allergies:  Allergies  Allergen Reactions  . Sulfur Other (See Comments)    Makes her extremely hyper    No prescriptions prior to admission    No results found for this or any previous visit (from the past 48 hour(s)).  No results found.   A comprehensive review of systems was negative except for: Hematologic/lymphatic: positive for easy bruising Musculoskeletal: positive for previous fracture Neurological: positive for headaches Behavioral/Psych: positive for depression  Height 5\' 5"  (1.651 m), weight 52.164 kg (115 lb).  General appearance: alert, cooperative and  appears stated age Head: Normocephalic, without obvious abnormality, atraumatic Neck: supple, symmetrical, trachea midline Resp: clear to auscultation bilaterally Cardio: regular rate and rhythm GI: non tender Extremities: intact sensation and capillary refill all digits.  +epl/fpl/io.  no wounds. Pulses: 2+ and symmetric Skin: Skin color, texture, turgor normal. No rashes or lesions Neurologic: Grossly normal Incision/Wound: none  Assessment/Plan Right ring finger trigger digit.  Non operative and operative treatment options were discussed with the patient and patient wishes to proceed with operative treatment. Risks, benefits, and alternatives of surgery were discussed and the patient agrees with the plan of care.   Arryn Terrones R 07/22/2014, 10:41 AM

## 2014-07-22 NOTE — Anesthesia Postprocedure Evaluation (Signed)
Anesthesia Post Note  Patient: [name] [name]  Procedure(s) Performed: Procedure(s) (LRB): RIGHT RING FINGER TRIGGER RELEASE  (Right)  Anesthesia type: General  Patient location: PACU  Post pain: Pain level controlled and Adequate analgesia  Post assessment: Post-op Vital signs reviewed, Patient's Cardiovascular Status Stable, Respiratory Function Stable, Patent Airway and Pain level controlled  Last Vitals:  Filed Vitals:   07/22/14 1345  BP:   Pulse: 73  Temp:   Resp: 20    Post vital signs: Reviewed and stable  Level of consciousness: awake, alert  and oriented  Complications: No apparent anesthesia complications

## 2014-07-23 NOTE — Op Note (Signed)
[name] [name], [name] [name]                ACCOUNT NO.:  [id number]  MEDICAL RECORD NO.:  [id number]  LOCATION:                                 FACILITY:  PHYSICIAN:  Leanora Cover, MD        DATE OF BIRTH:  20-Feb-1956  DATE OF PROCEDURE:  07/22/2014 DATE OF DISCHARGE:                              OPERATIVE REPORT   PREOPERATIVE DIAGNOSIS:  Right ring finger trigger digit.  POSTOPERATIVE DIAGNOSIS:  Right ring finger trigger digit.  PROCEDURE:  Right ring finger trigger digit release.  SURGEON:  Leanora Cover, MD  ASSISTANTS:  None.  ANESTHESIA:  General.  IV FLUIDS:  Per anesthesia flow sheet.  ESTIMATED BLOOD LOSS:  Minimal.  COMPLICATIONS:  None.  SPECIMENS:  None.  TOURNIQUET TIME:  9 minutes.  DISPOSITION:  Stable to PACU.  INDICATIONS:  [name] [name] is a 59 year old female who has had triggering of her right ring finger.  It is very bothersome to her.  It has recurred after injection.  She wished to have it released.  Risks, benefits, and alternatives of surgery were discussed including risk of blood loss, infection, damage to nerves, vessels, tendons, ligaments, bone, failure of surgery, need for additional surgery, complications with wound healing, continued pain, and recurrence of triggering.  She voiced understanding of these risks and elected to proceed.  OPERATIVE COURSE:  After being identified preoperatively by myself, the patient and I agreed upon procedure and site of procedure.  Surgical site was marked.  The risks, benefits, and alternatives were reviewed and she wished to proceed.  Surgical consent had been signed.  She was given IV Ancef as preoperative antibiotic prophylaxis.  She was transported to the operating room and placed on the operating room table in supine position with the right upper extremity on arm board.  General anesthesia was induced by anesthesiologist.  The right upper extremity was prepped and draped in normal sterile orthopedic  fashion.  A surgical pause was performed between surgeons, anesthesia, and operating room staff, and all were in agreement as to the patient, procedure and site of procedure.  Tourniquet at the proximal aspect of the extremity was inflated to 250 mmHg after exsanguination of the limb with Esmarch bandage.  Incision was made over the A1 pulley of the right ring finger. It was carried through subcutaneous tissues by spreading technique.  The A1 pulley was identified and sharply incised.  It was incised in its entirety.  The tendons were brought through the wound and separated. There was small amount of adherence between them.  A finger was placed through range of motion, there was no recurrence of triggering.  The wound was copiously irrigated with sterile saline.  It was closed with 4- 0 nylon in a horizontal mattress fashion and then injected with 3 mL of 0.25% plain Marcaine to aid in postoperative analgesia.  It was then dressed with sterile Xeroform, 4x4s, and wrapped with a Kling and Coban dressing lightly.  Tourniquet was deflated at 9 minutes.  Fingertips were pink with brisk capillary refill after deflation of the tourniquet. Operative drapes were broken down and the patient was awoken from anesthesia safely.  She was  transferred back to stretcher and taken to PACU in stable condition.  I will see her back in the office in 1 week for postoperative followup.  I will giver her Norco 5/325, 1-2 p.o. q.6 hours p.r.n. pain, dispensed #20.     Leanora Cover, MD     KK/MEDQ  D:  07/22/2014  T:  07/23/2014  Job:  [id number]

## 2014-07-26 ENCOUNTER — Encounter (HOSPITAL_BASED_OUTPATIENT_CLINIC_OR_DEPARTMENT_OTHER): Payer: Self-pay | Admitting: Orthopedic Surgery

## 2014-11-15 ENCOUNTER — Ambulatory Visit (INDEPENDENT_AMBULATORY_CARE_PROVIDER_SITE_OTHER): Payer: BLUE CROSS/BLUE SHIELD | Admitting: Podiatry

## 2014-11-15 ENCOUNTER — Ambulatory Visit (INDEPENDENT_AMBULATORY_CARE_PROVIDER_SITE_OTHER): Payer: BLUE CROSS/BLUE SHIELD

## 2014-11-15 DIAGNOSIS — D169 Benign neoplasm of bone and articular cartilage, unspecified: Secondary | ICD-10-CM | POA: Diagnosis not present

## 2014-11-15 DIAGNOSIS — M79674 Pain in right toe(s): Secondary | ICD-10-CM

## 2014-11-15 NOTE — Progress Notes (Signed)
   Subjective:    Patient ID: [name] [name], female    DOB: 10-Jan-1956, 59 y.o.   MRN: [id number]  HPI Right 5th toe pain corn abnormal nail growth buring needling throbbing. Had nail removed and scraping of corn and nail bed multiple times by PCP     Review of Systems  Allergic/Immunologic: Positive for environmental allergies and food allergies.  Neurological: Positive for headaches.  Hematological: Bruises/bleeds easily.       Objective:   Physical Exam        Assessment & Plan:

## 2014-11-16 NOTE — Progress Notes (Signed)
Subjective:     Patient ID: [name] [name], female   DOB: November 06, 1955, 59 y.o.   MRN: [id number]  HPI patient presents with pain in the right fifth toe stating that there is a very painful spur and that she has trouble wearing shoe gear and that she's tried to have it trimmed in the past and try to have the nail removed neither which is helped her   Review of Systems  All other systems reviewed and are negative.      Objective:   Physical Exam  Constitutional: She is oriented to person, place, and time.  Cardiovascular: Intact distal pulses.   Musculoskeletal: Normal range of motion.  Neurological: She is oriented to person, place, and time.  Skin: Skin is warm.  Nursing note and vitals reviewed.  neurovascular status intact muscle strength adequate with range of motion subtalar midtarsal joint within normal limits. Patient's noted to have a keratotic lesion on the inner side of the fifth toe right with fluid buildup and soreness when pressed with no breaking of the skin noted. The nail has been removed but is not part of the pain that this patient experiences     Assessment:     Painful exostosis fifth toe right foot medial side    Plan:     Reviewed H&P and x-rays with patient. Discussed condition and treatment options and due to the fact it's been trim numerous times and nails been removed without relief it would be best to go ahead and fix. Patient wants surgery and at this time I allowed her to read consent form for exostectomy fifth digit right allowing her to go over all possible complications that can occur and the fact there is no long-term guarantees as far success. She wants surgery understanding complications and signs consent form and is given all preoperative instructions. Patient is scheduled for outpatient surgery in the office in the next several weeks

## 2014-11-19 ENCOUNTER — Telehealth: Payer: Self-pay | Admitting: *Deleted

## 2014-11-19 NOTE — Telephone Encounter (Signed)
Pt asked if she need any medication for her nerves prior to the surgery.  I told her it was not necessary.

## 2014-11-22 ENCOUNTER — Ambulatory Visit (INDEPENDENT_AMBULATORY_CARE_PROVIDER_SITE_OTHER): Payer: BLUE CROSS/BLUE SHIELD | Admitting: Podiatry

## 2014-11-22 ENCOUNTER — Encounter: Payer: Self-pay | Admitting: Podiatry

## 2014-11-22 VITALS — BP 142/79 | HR 65 | Temp 97.7°F | Resp 16

## 2014-11-22 DIAGNOSIS — D169 Benign neoplasm of bone and articular cartilage, unspecified: Secondary | ICD-10-CM | POA: Diagnosis not present

## 2014-11-22 MED ORDER — HYDROCODONE-ACETAMINOPHEN 10-325 MG PO TABS: 1.0000 | ORAL_TABLET | Freq: Three times a day (TID) | ORAL | Status: DC | PRN

## 2014-11-23 NOTE — Progress Notes (Signed)
Subjective:     Patient ID: Cloey Sferrazza, female   DOB: August 30, 1955, 59 y.o.   MRN: [id number]  HPI patient presents for surgery fifth toe right foot stating she's excited to get it fixed   Review of Systems     Objective:   Physical Exam Neurovascular status intact with exostotic keratotic-type lesion distal medial aspect digit 5 right which is painful when pressed    Assessment:     Exostosis fifth digit right with pain    Plan:     Reviewed condition and recommended exostectomy and today I infiltrated the right fifth toe 60 Milligan times like Marcaine mixture removed the medial side under sterile technique and under tourniquet and exposed bone and utilizing a Christmas bur burred down the bone spur. Flushed the area took out bone paste and utilized 5-0 nylon and then applied sterile dressing and gave instructions on elevation soaks and prescribed pain medication for patient. Reappoint 2 weeks for suture removal or earlier if needed

## 2014-12-06 ENCOUNTER — Ambulatory Visit (INDEPENDENT_AMBULATORY_CARE_PROVIDER_SITE_OTHER): Payer: BLUE CROSS/BLUE SHIELD | Admitting: Podiatry

## 2014-12-06 ENCOUNTER — Encounter: Payer: Self-pay | Admitting: Podiatry

## 2014-12-06 ENCOUNTER — Ambulatory Visit (INDEPENDENT_AMBULATORY_CARE_PROVIDER_SITE_OTHER): Payer: BLUE CROSS/BLUE SHIELD

## 2014-12-06 VITALS — BP 137/77 | HR 53 | Resp 18

## 2014-12-06 DIAGNOSIS — D169 Benign neoplasm of bone and articular cartilage, unspecified: Secondary | ICD-10-CM

## 2014-12-06 DIAGNOSIS — Z9889 Other specified postprocedural states: Secondary | ICD-10-CM

## 2014-12-06 NOTE — Progress Notes (Signed)
Subjective:     Patient ID: [name] [name], female   DOB: Dec 07, 1955, 59 y.o.   MRN: [id number]  HPI patient states I'm doing fine with my toe and it seems that it's healing well with discomfort first few days but that has alleviated   Review of Systems     Objective:   Physical Exam Neurovascular status intact with keratotic lesion fifth digit right which is done well with surgery    Assessment:     Doing well post exostectomy fifth toe right    Plan:     Stitches removed wound edges coapted well and sterile dressing applied patient will be seen back to recheck

## 2014-12-22 ENCOUNTER — Encounter: Payer: Self-pay | Admitting: Podiatry

## 2014-12-22 ENCOUNTER — Ambulatory Visit (INDEPENDENT_AMBULATORY_CARE_PROVIDER_SITE_OTHER): Payer: BLUE CROSS/BLUE SHIELD

## 2014-12-22 ENCOUNTER — Ambulatory Visit (INDEPENDENT_AMBULATORY_CARE_PROVIDER_SITE_OTHER): Payer: BLUE CROSS/BLUE SHIELD | Admitting: Podiatry

## 2014-12-22 VITALS — BP 156/96 | HR 63 | Resp 15

## 2014-12-22 DIAGNOSIS — G8918 Other acute postprocedural pain: Secondary | ICD-10-CM

## 2014-12-22 DIAGNOSIS — D169 Benign neoplasm of bone and articular cartilage, unspecified: Secondary | ICD-10-CM

## 2014-12-22 NOTE — Progress Notes (Signed)
Subjective:     Patient ID: [name] [name], female   DOB: 10-Jan-1956, 59 y.o.   MRN: [id number]  HPI patient states that she still has some discomfort in the fifth toe which she wanted to make sure it was okay   Review of Systems     Objective:   Physical Exam Neurovascular status intact with crusted over area medial side distal fifth toe right with exostotic lesion was removed    Assessment:     Doing well post exostectomy right with mild discomfort which should resolve over time    Plan:     Reviewed condition and at this time advised on continued debridement technique and padding and it should resolve completely and reviewed final x-rays reappoint as needed

## 2015-04-10 ENCOUNTER — Encounter (HOSPITAL_COMMUNITY): Payer: Self-pay | Admitting: Emergency Medicine

## 2015-04-10 DIAGNOSIS — Z720 Tobacco use: Secondary | ICD-10-CM | POA: Diagnosis not present

## 2015-04-10 DIAGNOSIS — Z79899 Other long term (current) drug therapy: Secondary | ICD-10-CM | POA: Diagnosis not present

## 2015-04-10 DIAGNOSIS — E785 Hyperlipidemia, unspecified: Secondary | ICD-10-CM | POA: Insufficient documentation

## 2015-04-10 DIAGNOSIS — R079 Chest pain, unspecified: Secondary | ICD-10-CM | POA: Insufficient documentation

## 2015-04-10 DIAGNOSIS — Z85828 Personal history of other malignant neoplasm of skin: Secondary | ICD-10-CM | POA: Insufficient documentation

## 2015-04-10 DIAGNOSIS — R42 Dizziness and giddiness: Secondary | ICD-10-CM | POA: Insufficient documentation

## 2015-04-10 DIAGNOSIS — R0600 Dyspnea, unspecified: Secondary | ICD-10-CM | POA: Diagnosis not present

## 2015-04-10 DIAGNOSIS — I10 Essential (primary) hypertension: Secondary | ICD-10-CM | POA: Insufficient documentation

## 2015-04-10 DIAGNOSIS — Z8679 Personal history of other diseases of the circulatory system: Secondary | ICD-10-CM | POA: Diagnosis not present

## 2015-04-10 DIAGNOSIS — Z8719 Personal history of other diseases of the digestive system: Secondary | ICD-10-CM | POA: Insufficient documentation

## 2015-04-10 DIAGNOSIS — R0602 Shortness of breath: Secondary | ICD-10-CM | POA: Diagnosis present

## 2015-04-10 NOTE — ED Notes (Addendum)
Pt reports Tuesday she had what felt like a muscle spasm in L chest was seen by pcp and given antiinflammatory injection. Pt reports continued intermittent cp and increased sob since yesterday with dry cough. Pt also c/o dizziness, low hr and hypertension.

## 2015-04-11 ENCOUNTER — Emergency Department (HOSPITAL_COMMUNITY): Payer: BLUE CROSS/BLUE SHIELD

## 2015-04-11 ENCOUNTER — Emergency Department (HOSPITAL_COMMUNITY)
Admission: EM | Admit: 2015-04-11 | Discharge: 2015-04-11 | Disposition: A | Discharge status: Home / Self Care | Payer: BLUE CROSS/BLUE SHIELD | Attending: Emergency Medicine | Admitting: Emergency Medicine

## 2015-04-11 DIAGNOSIS — R0600 Dyspnea, unspecified: Secondary | ICD-10-CM

## 2015-04-11 LAB — BASIC METABOLIC PANEL
ANION GAP: 7 (ref 5–15)
BUN: <5 mg/dL — ABNORMAL LOW (ref 6–20)
CALCIUM: 9.9 mg/dL (ref 8.9–10.3)
CO2: 31 mmol/L (ref 22–32)
CREATININE: 0.67 mg/dL (ref 0.44–1.00)
Chloride: 97 mmol/L — ABNORMAL LOW (ref 101–111)
Glucose, Bld: 110 mg/dL — ABNORMAL HIGH (ref 65–99)
Potassium: 4.1 mmol/L (ref 3.5–5.1)
SODIUM: 135 mmol/L (ref 135–145)

## 2015-04-11 LAB — CBC
HCT: 40.1 % (ref 36.0–46.0)
HEMOGLOBIN: 13.6 g/dL (ref 12.0–15.0)
MCH: 33.5 pg (ref 26.0–34.0)
MCHC: 33.9 g/dL (ref 30.0–36.0)
MCV: 98.8 fL (ref 78.0–100.0)
PLATELETS: 299 10*3/uL (ref 150–400)
RBC: 4.06 MIL/uL (ref 3.87–5.11)
RDW: 13.4 % (ref 11.5–15.5)
WBC: 8.7 10*3/uL (ref 4.0–10.5)

## 2015-04-11 LAB — I-STAT TROPONIN, ED: TROPONIN I, POC: 0 ng/mL (ref 0.00–0.08)

## 2015-04-11 LAB — TROPONIN I

## 2015-04-11 MED ORDER — ALBUTEROL SULFATE HFA 108 (90 BASE) MCG/ACT IN AERS: 2.0000 | INHALATION_SPRAY | RESPIRATORY_TRACT | Status: AC | PRN

## 2015-04-11 MED ORDER — PREDNISONE 10 MG PO TABS: 60.0000 mg | ORAL_TABLET | Freq: Every day | ORAL | Status: DC

## 2015-04-11 MED ORDER — ALBUTEROL SULFATE HFA 108 (90 BASE) MCG/ACT IN AERS
2.0000 | INHALATION_SPRAY | RESPIRATORY_TRACT | Status: DC
  Administered 2015-04-11: 2 via RESPIRATORY_TRACT
  Filled 2015-04-11: qty 6.7

## 2015-04-11 MED ORDER — IOHEXOL 350 MG/ML SOLN
80.0000 mL | Freq: Once | INTRAVENOUS | Status: AC | PRN
  Administered 2015-04-11: 100 mL via INTRAVENOUS

## 2015-04-11 NOTE — ED Provider Notes (Signed)
CSN: [id number]     Arrival date & time 04/10/15  2347 History  This chart was scribed for Jola Schmidt, MD by Randa Evens, ED Scribe. This patient was seen in room D30C/D30C and the patient's care was started at 1:16 AM.    Chief Complaint  Patient presents with  . Shortness of Breath    The history is provided by the patient. No language interpreter was used.   HPI Comments: [name] [name] is a 59 y.o. female who presents to the Emergency Department complaining of worsening SOB onset 5 days prior. She states that the SOB recently worsened yesterday when she noticed after walking short distances she was more SOB than normal. Pt reports chest heaviness and light headedness. Pt states that her SOB is worse with exertion or deep breathing. Pt states that he symptoms initially began 5 days prior when she thought that she felt like she had a muscle spasm in the left chest. She states that she went to see her PCP 3 days prior where she received an anti-inflammatory injection.  No history DVT or pulmonary embolism.  She denies chest discomfort at this time unless she takes a deep breath at which point she notices that under her left breast.  No recent injury or fall.  Denies back pain.  No discomfort or pain in her arms.  Past Medical History  Diagnosis Date  . Hypertension   . Hyperlipidemia   . Skin cancer, basal cell   . Abnormal Pap smear 1982  . Mental disorder     anxiety   . Migraines   . Complication of anesthesia   . PONV (postoperative nausea and vomiting)   . GERD (gastroesophageal reflux disease)     no meds   Past Surgical History  Procedure Laterality Date  . Partial hysterectomy    . Tonsillectomy    . Appendectomy    . Cosmetic surgery      facial-  . Reconstructive eye surgery    . Anal fissure repair    . Laparotomy  05/14/2012    Procedure: EXPLORATORY LAPAROTOMY;  Surgeon: Rolm Bookbinder, MD;  Location: [facility];  Service: General;  Laterality: N/A;  egd, lysis  of adhesions, resection of duodenal diverticulm  . Abdominal hysterectomy    . Trigger finger release Right 07/22/2014    Procedure: RIGHT RING FINGER TRIGGER RELEASE ;  Surgeon: Leanora Cover, MD;  Location: [facility];  Service: Orthopedics;  Laterality: Right;   Family History  Problem Relation Age of Onset  . Hypertension Mother   . Stroke Mother   . Heart disease Mother    Social History  Substance Use Topics  . Smoking status: Current Every Day Smoker -- 0.50 packs/day for 20 years  . Smokeless tobacco: Never Used  . Alcohol Use: Yes     Comment: rarely   OB History    Gravida Para Term Preterm AB TAB SAB Ectopic Multiple Living   3 3 3       3      Review of Systems  Respiratory: Positive for shortness of breath.   Cardiovascular: Positive for chest pain.  Neurological: Positive for light-headedness.  All other systems reviewed and are negative.     Allergies  Sulfur  Home Medications   Prior to Admission medications   Medication Sig Start Date End Date Taking? Authorizing Provider  ALPRAZolam (XANAX) 0.25 MG tablet Take 0.25 mg by mouth at bedtime as needed. Anxiety    Historical Provider,  MD  butalbital-acetaminophen-caffeine (FIORICET, ESGIC) 50-325-40 MG per tablet Take 1 tablet by mouth 2 (two) times daily as needed. Migraine    Historical Provider, MD  Butalbital-APAP-Caffeine 50-300-40 MG CAPS  11/21/14   Historical Provider, MD  HYDROcodone-acetaminophen (NORCO) 10-325 MG per tablet Take 1 tablet by mouth every 8 (eight) hours as needed. 11/22/14   Wallene Huh, DPM  HYDROcodone-acetaminophen (NORCO) 5-325 MG per tablet 1-2 tabs po q6 hours prn pain 07/22/14   Leanora Cover, MD  metoprolol (LOPRESSOR) 50 MG tablet  10/22/14   Historical Provider, MD  metoprolol tartrate (LOPRESSOR) 25 MG tablet Take 50 mg by mouth 2 (two) times daily.     Historical Provider, MD  nitroGLYCERIN (NITROSTAT) 0.4 MG SL tablet Place 0.4 mg under the tongue every 5 (five)  minutes as needed. For chest pain    Historical Provider, MD  Probiotic Product (PROBIOTIC & ACIDOPHILUS EX ST PO) Take by mouth.    Historical Provider, MD  sertraline (ZOLOFT) 50 MG tablet Take 50 mg by mouth daily.    Historical Provider, MD  simvastatin (ZOCOR) 20 MG tablet Take 20 mg by mouth every evening.    Historical Provider, MD  traZODone (DESYREL) 100 MG tablet Take 25 mg by mouth at bedtime.    Historical Provider, MD  traZODone (DESYREL) 50 MG tablet  11/08/14   Historical Provider, MD   BP 156/71 mmHg  Pulse 55  Temp(Src) 98.2 F (36.8 C)  Resp 20  SpO2 100%   Physical Exam  Constitutional: She is oriented to person, place, and time. She appears well-developed and well-nourished. No distress.  HENT:  Head: Normocephalic and atraumatic.  Eyes: EOM are normal.  Neck: Normal range of motion.  Cardiovascular: Normal rate, regular rhythm and normal heart sounds.   Pulmonary/Chest: Effort normal and breath sounds normal.  Abdominal: Soft. She exhibits no distension. There is no tenderness.  Musculoskeletal: Normal range of motion.  Neurological: She is alert and oriented to person, place, and time.  Skin: Skin is warm and dry.  Psychiatric: She has a normal mood and affect. Judgment normal.  Nursing note and vitals reviewed.   ED Course  Procedures (including critical care time) DIAGNOSTIC STUDIES: Oxygen Saturation is 100% on RA, normal by my interpretation.    COORDINATION OF CARE: 1:38 AM-Discussed treatment plan with pt at bedside and pt agreed to plan.     Labs Review Labs Reviewed  BASIC METABOLIC PANEL - Abnormal; Notable for the following:    Chloride 97 (*)    Glucose, Bld 110 (*)    BUN <5 (*)    All other components within normal limits  CBC  TROPONIN I  I-STAT TROPOININ, ED    Imaging Review Dg Chest 2 View  04/11/2015   CLINICAL DATA:  Pain on LEFT side under breast since Tuesday, shortness of breath, cough, hypertension, GERD, smoker,  initial encounter  EXAM: CHEST  2 VIEW  COMPARISON:  04/01/2014  FINDINGS: Normal heart size, mediastinal contours, and pulmonary vascularity.  Emphysematous changes consistent with COPD.  Biapical scarring with LEFT upper lobe calcified granuloma again seen.  BILATERAL nipple shadows.  No pulmonary infiltrate, pleural effusion, or pneumothorax.  Bones demineralized.  IMPRESSION: Changes of COPD and old granulomatous disease with biapical scarring.  No acute abnormalities.   Electronically Signed   By: Lavonia Dana M.D.   On: 04/11/2015 00:44   Ct Angio Chest Pe W/cm &/or Wo Cm  04/11/2015   CLINICAL DATA:  Left-sided chest  pain and shortness of breath for 5 days.  EXAM: CT ANGIOGRAPHY CHEST WITH CONTRAST  TECHNIQUE: Multidetector CT imaging of the chest was performed using the standard protocol during bolus administration of intravenous contrast. Multiplanar CT image reconstructions and MIPs were obtained to evaluate the vascular anatomy.  CONTRAST:  147mL OMNIPAQUE IOHEXOL 350 MG/ML SOLN  COMPARISON:  None.  FINDINGS: Technically adequate study peer Good opacification of the central and segmental pulmonary arteries. No focal filling defects. No evidence of significant pulmonary embolus.  Normal heart size. Normal caliber thoracic aorta. Esophagus is decompressed. No significant lymphadenopathy in the chest.  Atelectasis in the lung bases, likely dependent. Emphysematous changes in the lungs most prominent in the apices no focal consolidation. No pleural effusions. No pneumothorax. Mild apical scarring.  Included portions of the upper abdominal organs demonstrate a left adrenal gland nodule measuring 2.7 cm diameter. Hounsfield unit measurements are consistent with fat containing adenoma. Old rib fractures. No destructive bone lesions.  Review of the MIP images confirms the above findings.  IMPRESSION: No evidence of significant pulmonary embolus. Emphysematous changes in the lungs. Dependent atelectasis.    Electronically Signed   By: Lucienne Capers M.D.   On: 04/11/2015 03:02  I personally reviewed the imaging tests through PACS system I reviewed available ER/hospitalization records through the EMR    EKG Interpretation #1 Date/Time:  Sunday April 10 2015 23:54:12 EDT Ventricular Rate:  56 PR Interval:  116 QRS Duration: 78 QT Interval:  404 QTC Calculation: 389 R Axis:   78 Text Interpretation:  Sinus bradycardia Nonspecific ST abnormality  Abnormal ECG nonspecific inferior lateral st changes as compared to prior  ecg Confirmed by CAMPOS  MD, Lennette Bihari ([id number]) on 04/11/2015 1:21:41 AM      ECG interpretation #2  Date: 04/11/2015 0422am  Rate: 53    Rhythm: normal sinus rhythm  QRS Axis: normal  Intervals: normal  ST/T Wave abnormalities: normal  Conduction Disutrbances: none  Narrative Interpretation:   Old EKG Reviewed: No significant changes noted     MDM   Final diagnoses:  Dyspnea     7:40 AM I ambulated the patient emergency department.  She walked briskly.  She had no increased work of breathing.  She had no chest pain or pressure.  Repeat 12-lead EKG obtained at 420 2 in the morning was after ambulating briskly in the emergency department.  No reproduction of her discomfort or pain.  CT scan negative for pulmonary embolism.  Patient be given bronchodilators and asked to follow-up with her primary care physician.  Troponin is negative 2.  Patient understands to return to the ER for new or worsening symptoms  I personally performed the services described in this documentation, which was scribed in my presence. The recorded information has been reviewed and is accurate.        Jola Schmidt, MD 04/11/15 [phone]

## 2015-04-11 NOTE — ED Notes (Signed)
Patient ambulated in hallway with dr. Venora Maples.

## 2015-04-11 NOTE — ED Notes (Signed)
Dr. Campos at the bedside.  

## 2015-04-11 NOTE — Discharge Instructions (Signed)

## 2015-11-21 ENCOUNTER — Other Ambulatory Visit: Payer: Self-pay | Admitting: Internal Medicine

## 2015-11-21 DIAGNOSIS — R1084 Generalized abdominal pain: Secondary | ICD-10-CM

## 2015-11-29 ENCOUNTER — Ambulatory Visit
Admission: RE | Admit: 2015-11-29 | Discharge: 2015-11-29 | Disposition: A | Discharge status: Home / Self Care | Payer: BLUE CROSS/BLUE SHIELD | Source: Ambulatory Visit | Attending: Internal Medicine | Admitting: Internal Medicine

## 2015-11-29 DIAGNOSIS — R1084 Generalized abdominal pain: Secondary | ICD-10-CM

## 2015-12-01 ENCOUNTER — Other Ambulatory Visit: Payer: Self-pay | Admitting: Gastroenterology

## 2015-12-01 ENCOUNTER — Ambulatory Visit
Admission: RE | Admit: 2015-12-01 | Discharge: 2015-12-01 | Disposition: A | Discharge status: Home / Self Care | Payer: BLUE CROSS/BLUE SHIELD | Source: Ambulatory Visit | Attending: Gastroenterology | Admitting: Gastroenterology

## 2015-12-01 DIAGNOSIS — R1084 Generalized abdominal pain: Secondary | ICD-10-CM

## 2015-12-01 MED ORDER — IOPAMIDOL (ISOVUE-370) INJECTION 76%
100.0000 mL | Freq: Once | INTRAVENOUS | Status: AC | PRN
  Administered 2015-12-01: 100 mL via INTRAVENOUS

## 2015-12-30 ENCOUNTER — Emergency Department (HOSPITAL_COMMUNITY)
Admission: EM | Admit: 2015-12-30 | Discharge: 2015-12-30 | Disposition: A | Discharge status: Home / Self Care | Payer: BLUE CROSS/BLUE SHIELD | Attending: Emergency Medicine | Admitting: Emergency Medicine

## 2015-12-30 ENCOUNTER — Encounter (HOSPITAL_COMMUNITY): Payer: Self-pay | Admitting: Emergency Medicine

## 2015-12-30 ENCOUNTER — Emergency Department (HOSPITAL_COMMUNITY): Payer: BLUE CROSS/BLUE SHIELD

## 2015-12-30 DIAGNOSIS — F17200 Nicotine dependence, unspecified, uncomplicated: Secondary | ICD-10-CM | POA: Insufficient documentation

## 2015-12-30 DIAGNOSIS — R1084 Generalized abdominal pain: Secondary | ICD-10-CM | POA: Insufficient documentation

## 2015-12-30 DIAGNOSIS — Z79899 Other long term (current) drug therapy: Secondary | ICD-10-CM | POA: Diagnosis not present

## 2015-12-30 DIAGNOSIS — Z85828 Personal history of other malignant neoplasm of skin: Secondary | ICD-10-CM | POA: Diagnosis not present

## 2015-12-30 DIAGNOSIS — I10 Essential (primary) hypertension: Secondary | ICD-10-CM | POA: Insufficient documentation

## 2015-12-30 DIAGNOSIS — R109 Unspecified abdominal pain: Secondary | ICD-10-CM

## 2015-12-30 LAB — CBC WITH DIFFERENTIAL/PLATELET
BASOS ABS: 0.1 10*3/uL (ref 0.0–0.1)
Basophils Relative: 1 %
EOS ABS: 0.1 10*3/uL (ref 0.0–0.7)
EOS PCT: 1 %
HCT: 40.5 % (ref 36.0–46.0)
Hemoglobin: 13.8 g/dL (ref 12.0–15.0)
LYMPHS ABS: 3.2 10*3/uL (ref 0.7–4.0)
Lymphocytes Relative: 38 %
MCH: 33.3 pg (ref 26.0–34.0)
MCHC: 34.1 g/dL (ref 30.0–36.0)
MCV: 97.8 fL (ref 78.0–100.0)
Monocytes Absolute: 0.8 10*3/uL (ref 0.1–1.0)
Monocytes Relative: 10 %
NEUTROS PCT: 50 %
Neutro Abs: 4.1 10*3/uL (ref 1.7–7.7)
PLATELETS: 308 10*3/uL (ref 150–400)
RBC: 4.14 MIL/uL (ref 3.87–5.11)
RDW: 13.4 % (ref 11.5–15.5)
WBC: 8.3 10*3/uL (ref 4.0–10.5)

## 2015-12-30 LAB — COMPREHENSIVE METABOLIC PANEL
ALT: 14 U/L (ref 14–54)
AST: 17 U/L (ref 15–41)
Albumin: 4.5 g/dL (ref 3.5–5.0)
Alkaline Phosphatase: 130 U/L — ABNORMAL HIGH (ref 38–126)
Anion gap: 6 (ref 5–15)
BUN: <5 mg/dL — ABNORMAL LOW (ref 6–20)
CHLORIDE: 99 mmol/L — AB (ref 101–111)
CO2: 29 mmol/L (ref 22–32)
CREATININE: 0.71 mg/dL (ref 0.44–1.00)
Calcium: 9.9 mg/dL (ref 8.9–10.3)
GFR calc non Af Amer: >60 mL/min (ref >60)
Glucose, Bld: 91 mg/dL (ref 65–99)
POTASSIUM: 4.1 mmol/L (ref 3.5–5.1)
SODIUM: 134 mmol/L — AB (ref 135–145)
Total Bilirubin: 0.6 mg/dL (ref 0.3–1.2)
Total Protein: 7.8 g/dL (ref 6.5–8.1)

## 2015-12-30 LAB — URINALYSIS, ROUTINE W REFLEX MICROSCOPIC
Bilirubin Urine: NEGATIVE
Glucose, UA: NEGATIVE mg/dL
Ketones, ur: NEGATIVE mg/dL
LEUKOCYTES UA: NEGATIVE
NITRITE: NEGATIVE
PROTEIN: NEGATIVE mg/dL
SPECIFIC GRAVITY, URINE: 1.007 (ref 1.005–1.030)
pH: 6 (ref 5.0–8.0)

## 2015-12-30 LAB — URINE MICROSCOPIC-ADD ON

## 2015-12-30 LAB — POC OCCULT BLOOD, ED: FECAL OCCULT BLD: NEGATIVE

## 2015-12-30 MED ORDER — HYDROMORPHONE HCL 1 MG/ML IJ SOLN
0.5000 mg | Freq: Once | INTRAMUSCULAR | Status: AC
  Administered 2015-12-30: 0.5 mg via INTRAVENOUS
  Filled 2015-12-30: qty 1

## 2015-12-30 MED ORDER — DIATRIZOATE MEGLUMINE & SODIUM 66-10 % PO SOLN
30.0000 mL | Freq: Once | ORAL | Status: AC
  Administered 2015-12-30: 30 mL via ORAL

## 2015-12-30 MED ORDER — OXYCODONE-ACETAMINOPHEN 5-325 MG PO TABS: 2.0000 | ORAL_TABLET | ORAL | Status: DC | PRN

## 2015-12-30 MED ORDER — SODIUM CHLORIDE 0.9 % IV BOLUS (SEPSIS)
1000.0000 mL | Freq: Once | INTRAVENOUS | Status: AC
  Administered 2015-12-30: 1000 mL via INTRAVENOUS

## 2015-12-30 MED ORDER — ONDANSETRON HCL 4 MG/2ML IJ SOLN
4.0000 mg | Freq: Once | INTRAMUSCULAR | Status: AC
  Administered 2015-12-30: 4 mg via INTRAVENOUS
  Filled 2015-12-30: qty 2

## 2015-12-30 MED ORDER — PANTOPRAZOLE SODIUM 40 MG PO TBEC
40.0000 mg | DELAYED_RELEASE_TABLET | Freq: Once | ORAL | Status: AC
  Administered 2015-12-30: 40 mg via ORAL
  Filled 2015-12-30: qty 1

## 2015-12-30 MED ORDER — FAMOTIDINE IN NACL 20-0.9 MG/50ML-% IV SOLN
20.0000 mg | Freq: Once | INTRAVENOUS | Status: AC
  Administered 2015-12-30: 20 mg via INTRAVENOUS
  Filled 2015-12-30: qty 50

## 2015-12-30 MED ORDER — IOPAMIDOL (ISOVUE-300) INJECTION 61%
100.0000 mL | Freq: Once | INTRAVENOUS | Status: AC | PRN
  Administered 2015-12-30: 100 mL via INTRAVENOUS

## 2015-12-30 MED ORDER — PANTOPRAZOLE SODIUM 20 MG PO TBEC: 20.0000 mg | DELAYED_RELEASE_TABLET | Freq: Every day | ORAL | Status: DC

## 2015-12-30 NOTE — ED Provider Notes (Signed)
CSN: [id number]     Arrival date & time 12/30/15  1256 History   First MD Initiated Contact with Patient 12/30/15 1316     Chief Complaint  Patient presents with  . Abdominal Pain  . Back Pain     (Consider location/radiation/quality/duration/timing/severity/associated sxs/prior Treatment) Patient is a 60 y.o. female presenting with abdominal pain and back pain. The history is provided by the patient (Patient states that she had some black stools. She's recently had endoscopy and colonoscopy done. She has chronic abdominal discomfort).  Abdominal Pain Pain location:  Generalized Pain quality: aching   Pain radiates to:  Does not radiate Pain severity:  Moderate Onset quality:  Gradual Timing:  Constant Progression:  Waxing and waning Chronicity:  Recurrent Context: not alcohol use   Associated symptoms: no chest pain, no cough, no diarrhea, no fatigue and no hematuria   Back Pain Associated symptoms: abdominal pain   Associated symptoms: no chest pain and no headaches     Past Medical History  Diagnosis Date  . Hypertension   . Hyperlipidemia   . Skin cancer, basal cell   . Abnormal Pap smear 1982  . Mental disorder     anxiety   . Migraines   . Complication of anesthesia   . PONV (postoperative nausea and vomiting)   . GERD (gastroesophageal reflux disease)     no meds   Past Surgical History  Procedure Laterality Date  . Partial hysterectomy    . Tonsillectomy    . Appendectomy    . Cosmetic surgery      facial-  . Reconstructive eye surgery    . Anal fissure repair    . Laparotomy  05/14/2012    Procedure: EXPLORATORY LAPAROTOMY;  Surgeon: Rolm Bookbinder, MD;  Location: [facility];  Service: General;  Laterality: N/A;  egd, lysis of adhesions, resection of duodenal diverticulm  . Abdominal hysterectomy    . Trigger finger release Right 07/22/2014    Procedure: RIGHT RING FINGER TRIGGER RELEASE ;  Surgeon: Leanora Cover, MD;  Location: [facility];   Service: Orthopedics;  Laterality: Right;   Family History  Problem Relation Age of Onset  . Hypertension Mother   . Stroke Mother   . Heart disease Mother    Social History  Substance Use Topics  . Smoking status: Current Every Day Smoker -- 0.50 packs/day for 20 years  . Smokeless tobacco: Never Used  . Alcohol Use: Yes     Comment: rarely   OB History    Gravida Para Term Preterm AB TAB SAB Ectopic Multiple Living   3 3 3       3      Review of Systems  Constitutional: Negative for appetite change and fatigue.  HENT: Negative for congestion, ear discharge and sinus pressure.   Eyes: Negative for discharge.  Respiratory: Negative for cough.   Cardiovascular: Negative for chest pain.  Gastrointestinal: Positive for abdominal pain. Negative for diarrhea.       Black stools  Genitourinary: Negative for frequency and hematuria.  Musculoskeletal: Positive for back pain.  Skin: Negative for rash.  Neurological: Negative for seizures and headaches.  Psychiatric/Behavioral: Negative for hallucinations.      Allergies  Eggs or egg-derived products; Fish allergy; Corn-containing products; and Sulfur  Home Medications   Prior to Admission medications   Medication Sig Start Date End Date Taking? Authorizing Provider  acetaminophen (TYLENOL) 500 MG tablet Take 1,000 mg by mouth every 6 (six) hours as needed  for headache.   Yes Historical Provider, MD  albuterol (PROVENTIL HFA;VENTOLIN HFA) 108 (90 BASE) MCG/ACT inhaler Inhale 2 puffs into the lungs every 4 (four) hours as needed for wheezing or shortness of breath. 04/11/15  Yes Jola Schmidt, MD  ALPRAZolam Duanne Moron) 0.25 MG tablet Take 0.25 mg by mouth at bedtime as needed. Anxiety   Yes Historical Provider, MD  butalbital-acetaminophen-caffeine (FIORICET, ESGIC) 50-325-40 MG per tablet Take 1 tablet by mouth every 6 (six) hours as needed for migraine. Migraine   Yes Historical Provider, MD  HYDROcodone-acetaminophen (NORCO) 5-325  MG per tablet 1-2 tabs po q6 hours prn pain 07/22/14  Yes Leanora Cover, MD  metoprolol tartrate (LOPRESSOR) 25 MG tablet Take 25 mg by mouth 2 (two) times daily.   Yes Historical Provider, MD  nitroGLYCERIN (NITROSTAT) 0.4 MG SL tablet Place 0.4 mg under the tongue every 5 (five) minutes as needed. For chest pain   Yes Historical Provider, MD  Probiotic Product (PROBIOTIC & ACIDOPHILUS EX ST PO) Take 1 capsule by mouth daily.    Yes Historical Provider, MD  sertraline (ZOLOFT) 50 MG tablet Take 75 mg by mouth daily.    Yes Historical Provider, MD  simvastatin (ZOCOR) 20 MG tablet Take 20 mg by mouth every evening.   Yes Historical Provider, MD  traZODone (DESYREL) 50 MG tablet Take 50 mg by mouth at bedtime as needed for sleep.  11/08/14  Yes Historical Provider, MD  oxyCODONE-acetaminophen (PERCOCET) 5-325 MG tablet Take 2 tablets by mouth every 4 (four) hours as needed. 12/30/15   Milton Ferguson, MD  pantoprazole (PROTONIX) 20 MG tablet Take 1 tablet (20 mg total) by mouth daily. 12/30/15   Milton Ferguson, MD   BP 125/76 mmHg  Pulse 58  Temp(Src) 98.3 F (36.8 C) (Oral)  Resp 18  SpO2 97% Physical Exam  Constitutional: She is oriented to person, place, and time. She appears well-developed.  HENT:  Head: Normocephalic.  Eyes: Conjunctivae and EOM are normal. No scleral icterus.  Neck: Neck supple. No thyromegaly present.  Cardiovascular: Normal rate and regular rhythm.  Exam reveals no gallop and no friction rub.   No murmur heard. Pulmonary/Chest: No stridor. She has no wheezes. She has no rales. She exhibits no tenderness.  Abdominal: She exhibits no distension. There is tenderness. There is no rebound.  Mild tenderness throughout  Genitourinary:  Rectal exam shows brown stool that was seen negative  Musculoskeletal: Normal range of motion. She exhibits no edema.  Lymphadenopathy:    She has no cervical adenopathy.  Neurological: She is oriented to person, place, and time. She exhibits  normal muscle tone. Coordination normal.  Skin: No rash noted. No erythema.  Psychiatric: She has a normal mood and affect. Her behavior is normal.    ED Course  Procedures (including critical care time) Labs Review Labs Reviewed  COMPREHENSIVE METABOLIC PANEL - Abnormal; Notable for the following:    Sodium 134 (*)    Chloride 99 (*)    BUN <5 (*)    Alkaline Phosphatase 130 (*)    All other components within normal limits  URINALYSIS, ROUTINE W REFLEX MICROSCOPIC (NOT AT [facility]) - Abnormal; Notable for the following:    Hgb urine dipstick MODERATE (*)    All other components within normal limits  URINE MICROSCOPIC-ADD ON - Abnormal; Notable for the following:    Squamous Epithelial / LPF 0-5 (*)    Bacteria, UA RARE (*)    All other components within normal limits  CBC  WITH DIFFERENTIAL/PLATELET  OCCULT BLOOD X 1 CARD TO LAB, STOOL  POC OCCULT BLOOD, ED    Imaging Review Ct Abdomen Pelvis W Contrast  12/30/2015  CLINICAL DATA:  60 year old with generalized abdominal pain radiating to the low back associated with black tarry stools, onset yesterday. Patient had colonoscopy on Wednesday with polyp removal. Personal history of anal fissure repair and repair of ruptured duodenal ulcer. Surgical history also includes hysterectomy and appendectomy. EXAM: CT ABDOMEN AND PELVIS WITH CONTRAST TECHNIQUE: Multidetector CT imaging of the abdomen and pelvis was performed using the standard protocol following bolus administration of intravenous contrast. CONTRAST:  133mL ISOVUE-300 IOPAMIDOL (ISOVUE-300) INJECTION 61%. Oral contrast was also administered. COMPARISON:  CTs 12/01/2015, 05/14/2012, 05/07/2012 and MRI abdomen 12/08/2013. FINDINGS: Lower chest: Minimal dependent atelectasis posteriorly in the lower lobes, as expected. Normal heart size. Hepatobiliary: 5 mm and smaller low-attenuation lesions involving the right lobe of the liver, not significantly changed dating back to October, 2013,  therefore benign. No new or suspicious hepatic findings. Gallbladder normal in appearance without calcified gallstones. No biliary ductal dilation. Pancreas: Normal in appearance without evidence of mass, ductal dilation, or inflammation. Spleen: Normal in size and appearance. Adrenals/Urinary Tract: Bilateral adrenal masses, that on the right measuring approximately 3.0 by 2.7 and on the left approximately 1.6 x 1.1 cm, unchanged dating back to 2013. Subcentimeter cortical cyst involving the mid anterior left kidney. No significant abnormality involving either kidney. No hydronephrosis. Urinary bladder unremarkable. Stomach/Bowel: Stomach normal in appearance for the degree of distention. Surgical suture material overlying the duodenum at the site of previous ulcer repair. Normal-appearing small bowel. Normal-appearing colon with expected stool burden. Surgically absent appendix. 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 Visceral arteries widely patent. Normal-appearing portal venous and systemic venous systems. No pathologic lymphadenopathy. Reproductive: Surgically absent uterus.  No adnexal masses. Other: None. Musculoskeletal: No acute or significant abnormalities. IMPRESSION: 1. No acute abnormalities involving the abdomen or pelvis. 2. Bilateral adrenal adenomas, stable dating back to 2013. Electronically Signed   By: Evangeline Dakin M.D.   On: 12/30/2015 16:23   I have personally reviewed and evaluated these images and lab results as part of my medical decision-making.   EKG Interpretation None      MDM   Final diagnoses:  Abdominal pain in female    Patient with chronic abdominal discomfort history of black stool. She recently had endoscopy that included an upper endoscopy that showed some gastritis and colonoscopy that showed some polyps. Labs were unremarkable he will call with negative patient not orthostatic CT scan of the abdomen was  unremarkable. Patient was sent home on protonic and Percocet and will follow-up with her GI doctor    Milton Ferguson, MD 12/30/15 [phone]

## 2015-12-30 NOTE — Discharge Instructions (Signed)
Follow up with your md next week.  Return if needed 

## 2015-12-30 NOTE — ED Notes (Addendum)
Pt c/o generalized abdominal pain that radiates to lower back with black tarry stools since Thursday. Pt had colonoscopy on Wednesday and had polyps removed.

## 2016-01-27 ENCOUNTER — Other Ambulatory Visit: Payer: Self-pay | Admitting: General Surgery

## 2016-01-27 DIAGNOSIS — R109 Unspecified abdominal pain: Secondary | ICD-10-CM

## 2016-01-31 ENCOUNTER — Ambulatory Visit
Admission: RE | Admit: 2016-01-31 | Discharge: 2016-01-31 | Disposition: A | Discharge status: Home / Self Care | Payer: BLUE CROSS/BLUE SHIELD | Source: Ambulatory Visit | Attending: General Surgery | Admitting: General Surgery

## 2016-01-31 DIAGNOSIS — R109 Unspecified abdominal pain: Secondary | ICD-10-CM

## 2016-05-28 ENCOUNTER — Other Ambulatory Visit: Payer: Self-pay | Admitting: Internal Medicine

## 2016-05-28 DIAGNOSIS — E2839 Other primary ovarian failure: Secondary | ICD-10-CM

## 2016-06-07 ENCOUNTER — Ambulatory Visit
Admission: RE | Admit: 2016-06-07 | Discharge: 2016-06-07 | Disposition: A | Discharge status: Home / Self Care | Payer: BLUE CROSS/BLUE SHIELD | Source: Ambulatory Visit | Attending: Internal Medicine | Admitting: Internal Medicine

## 2016-06-07 DIAGNOSIS — E2839 Other primary ovarian failure: Secondary | ICD-10-CM

## 2016-08-14 ENCOUNTER — Encounter (HOSPITAL_COMMUNITY): Payer: Self-pay | Admitting: Emergency Medicine

## 2016-08-14 DIAGNOSIS — F17200 Nicotine dependence, unspecified, uncomplicated: Secondary | ICD-10-CM | POA: Insufficient documentation

## 2016-08-14 DIAGNOSIS — I10 Essential (primary) hypertension: Secondary | ICD-10-CM | POA: Diagnosis not present

## 2016-08-14 DIAGNOSIS — Z79899 Other long term (current) drug therapy: Secondary | ICD-10-CM | POA: Diagnosis not present

## 2016-08-14 DIAGNOSIS — R1011 Right upper quadrant pain: Secondary | ICD-10-CM | POA: Diagnosis not present

## 2016-08-14 DIAGNOSIS — Z85828 Personal history of other malignant neoplasm of skin: Secondary | ICD-10-CM | POA: Diagnosis not present

## 2016-08-14 LAB — CBC WITH DIFFERENTIAL/PLATELET
Basophils Absolute: 0 10*3/uL (ref 0.0–0.1)
Basophils Relative: 0 %
EOS ABS: 0.2 10*3/uL (ref 0.0–0.7)
EOS PCT: 2 %
HCT: 38.7 % (ref 36.0–46.0)
Hemoglobin: 13.2 g/dL (ref 12.0–15.0)
Lymphocytes Relative: 29 %
Lymphs Abs: 2.7 10*3/uL (ref 0.7–4.0)
MCH: 33.3 pg (ref 26.0–34.0)
MCHC: 34.1 g/dL (ref 30.0–36.0)
MCV: 97.7 fL (ref 78.0–100.0)
Monocytes Absolute: 1 10*3/uL (ref 0.1–1.0)
Monocytes Relative: 11 %
Neutro Abs: 5.2 10*3/uL (ref 1.7–7.7)
Neutrophils Relative %: 58 %
PLATELETS: 306 10*3/uL (ref 150–400)
RBC: 3.96 MIL/uL (ref 3.87–5.11)
RDW: 13.7 % (ref 11.5–15.5)
WBC: 9.1 10*3/uL (ref 4.0–10.5)

## 2016-08-14 LAB — COMPREHENSIVE METABOLIC PANEL
ALT: 15 U/L (ref 14–54)
AST: 19 U/L (ref 15–41)
Albumin: 3.8 g/dL (ref 3.5–5.0)
Alkaline Phosphatase: 129 U/L — ABNORMAL HIGH (ref 38–126)
Anion gap: 10 (ref 5–15)
BUN: 7 mg/dL (ref 6–20)
CHLORIDE: 98 mmol/L — AB (ref 101–111)
CO2: 26 mmol/L (ref 22–32)
CREATININE: 0.78 mg/dL (ref 0.44–1.00)
Calcium: 9.7 mg/dL (ref 8.9–10.3)
GFR calc non Af Amer: >60 mL/min (ref >60)
Glucose, Bld: 100 mg/dL — ABNORMAL HIGH (ref 65–99)
POTASSIUM: 4.2 mmol/L (ref 3.5–5.1)
SODIUM: 134 mmol/L — AB (ref 135–145)
Total Bilirubin: 0.4 mg/dL (ref 0.3–1.2)
Total Protein: 6.7 g/dL (ref 6.5–8.1)

## 2016-08-14 LAB — URINALYSIS, ROUTINE W REFLEX MICROSCOPIC
BILIRUBIN URINE: NEGATIVE
Glucose, UA: NEGATIVE mg/dL
KETONES UR: NEGATIVE mg/dL
Leukocytes, UA: NEGATIVE
NITRITE: NEGATIVE
Protein, ur: 30 mg/dL — AB
Specific Gravity, Urine: 1.017 (ref 1.005–1.030)
pH: 7 (ref 5.0–8.0)

## 2016-08-14 NOTE — ED Triage Notes (Signed)
Pt c/o mid to RUQ pain onset approx 2 hours ago.  Pt. Denies any nausea, vomiting or diarrhea.

## 2016-08-15 ENCOUNTER — Emergency Department (HOSPITAL_COMMUNITY): Payer: BLUE CROSS/BLUE SHIELD

## 2016-08-15 ENCOUNTER — Emergency Department (HOSPITAL_COMMUNITY)
Admission: EM | Admit: 2016-08-15 | Discharge: 2016-08-15 | Disposition: A | Discharge status: Home / Self Care | Payer: BLUE CROSS/BLUE SHIELD | Attending: Emergency Medicine | Admitting: Emergency Medicine

## 2016-08-15 DIAGNOSIS — R1011 Right upper quadrant pain: Secondary | ICD-10-CM

## 2016-08-15 DIAGNOSIS — R109 Unspecified abdominal pain: Secondary | ICD-10-CM

## 2016-08-15 LAB — LIPASE, BLOOD: Lipase: 39 U/L (ref 11–51)

## 2016-08-15 MED ORDER — IOPAMIDOL (ISOVUE-300) INJECTION 61%
INTRAVENOUS | Status: AC
  Administered 2016-08-15: 100 mL via INTRAVENOUS
  Filled 2016-08-15: qty 100

## 2016-08-15 MED ORDER — MORPHINE SULFATE (PF) 4 MG/ML IV SOLN
4.0000 mg | Freq: Once | INTRAVENOUS | Status: AC
  Administered 2016-08-15: 4 mg via INTRAVENOUS
  Filled 2016-08-15: qty 1

## 2016-08-15 MED ORDER — OXYCODONE-ACETAMINOPHEN 5-325 MG PO TABS: 1.0000 | ORAL_TABLET | ORAL | 0 refills | Status: DC | PRN

## 2016-08-15 MED ORDER — METOCLOPRAMIDE HCL 5 MG/ML IJ SOLN
10.0000 mg | Freq: Once | INTRAMUSCULAR | Status: AC
  Administered 2016-08-15: 10 mg via INTRAVENOUS
  Filled 2016-08-15: qty 2

## 2016-08-15 MED ORDER — IOPAMIDOL (ISOVUE-300) INJECTION 61%
INTRAVENOUS | Status: AC
  Filled 2016-08-15: qty 50

## 2016-08-15 MED ORDER — METOCLOPRAMIDE HCL 10 MG PO TABS: 10.0000 mg | ORAL_TABLET | Freq: Four times a day (QID) | ORAL | 0 refills | Status: DC | PRN

## 2016-08-15 MED ORDER — ONDANSETRON HCL 4 MG/2ML IJ SOLN
4.0000 mg | Freq: Once | INTRAMUSCULAR | Status: AC
  Administered 2016-08-15: 4 mg via INTRAVENOUS
  Filled 2016-08-15: qty 2

## 2016-08-15 NOTE — ED Provider Notes (Signed)
[facility] DEPT Provider Note   CSN: [id number] Arrival date & time: 08/14/16  2222   By signing my name below, I, [name] [name], attest that this documentation has been prepared under the direction and in the presence of Delora Fuel, MD  Electronically Signed: Delton [name], ED Scribe. 08/15/16. 12:34 AM.   History   Chief Complaint Chief Complaint  Patient presents with  . Abdominal Pain   The history is provided by the patient. No language interpreter was used.   HPI Comments:  [name] [name] is a 61 y.o. female , with a hx of GERD, hyperlipidemia, HTN and PSHx of appendectomy, who presents to the Emergency Department complaining of right sided "9-10/10" abdominal pain x 8 PM today. She also reports nausea, back pain and chills. Her pain is worse upon palpation and notes it is consistent with the pain she experienced when she ruptured her duodenum. She denies vomiting, diaphoresis, chest pain and any other associated symptoms at this time.   PCP: Myriam Jacobson, MD   Past Medical History:  Diagnosis Date  . Abnormal Pap smear 1982  . Complication of anesthesia   . GERD (gastroesophageal reflux disease)    no meds  . Hyperlipidemia   . Hypertension   . Mental disorder    anxiety   . Migraines   . PONV (postoperative nausea and vomiting)   . Skin cancer, basal cell     There are no active problems to display for this patient.   Past Surgical History:  Procedure Laterality Date  . ABDOMINAL HYSTERECTOMY    . ANAL FISSURE REPAIR    . APPENDECTOMY    . COSMETIC SURGERY     facial-  . LAPAROTOMY  05/14/2012   Procedure: EXPLORATORY LAPAROTOMY;  Surgeon: Rolm Bookbinder, MD;  Location: [facility];  Service: General;  Laterality: N/A;  egd, lysis of adhesions, resection of duodenal diverticulm  . PARTIAL HYSTERECTOMY    . reconstructive eye surgery    . TONSILLECTOMY    . TRIGGER FINGER RELEASE Right 07/22/2014   Procedure: RIGHT RING FINGER TRIGGER RELEASE ;   Surgeon: Leanora Cover, MD;  Location: [facility];  Service: Orthopedics;  Laterality: Right;    OB History    Gravida Para Term Preterm AB Living   3 3 3     3    SAB TAB Ectopic Multiple Live Births                   Home Medications    Prior to Admission medications   Medication Sig Start Date End Date Taking? Authorizing Provider  acetaminophen (TYLENOL) 500 MG tablet Take 1,000 mg by mouth every 6 (six) hours as needed for headache.    Historical Provider, MD  albuterol (PROVENTIL HFA;VENTOLIN HFA) 108 (90 BASE) MCG/ACT inhaler Inhale 2 puffs into the lungs every 4 (four) hours as needed for wheezing or shortness of breath. 04/11/15   Jola Schmidt, MD  ALPRAZolam Duanne Moron) 0.25 MG tablet Take 0.25 mg by mouth at bedtime as needed. Anxiety    Historical Provider, MD  butalbital-acetaminophen-caffeine (FIORICET, ESGIC) 50-325-40 MG per tablet Take 1 tablet by mouth every 6 (six) hours as needed for migraine. Migraine    Historical Provider, MD  HYDROcodone-acetaminophen [facility]) 5-325 MG per tablet 1-2 tabs po q6 hours prn pain 07/22/14   Leanora Cover, MD  metoprolol tartrate (LOPRESSOR) 25 MG tablet Take 25 mg by mouth 2 (two) times daily.    Historical Provider, MD  nitroGLYCERIN (  NITROSTAT) 0.4 MG SL tablet Place 0.4 mg under the tongue every 5 (five) minutes as needed. For chest pain    Historical Provider, MD  oxyCODONE-acetaminophen (PERCOCET) 5-325 MG tablet Take 2 tablets by mouth every 4 (four) hours as needed. 12/30/15   Milton Ferguson, MD  pantoprazole (PROTONIX) 20 MG tablet Take 1 tablet (20 mg total) by mouth daily. 12/30/15   Milton Ferguson, MD  Probiotic Product (PROBIOTIC & ACIDOPHILUS EX ST PO) Take 1 capsule by mouth daily.     Historical Provider, MD  sertraline (ZOLOFT) 50 MG tablet Take 75 mg by mouth daily.     Historical Provider, MD  simvastatin (ZOCOR) 20 MG tablet Take 20 mg by mouth every evening.    Historical Provider, MD  traZODone (DESYREL) 50 MG  tablet Take 50 mg by mouth at bedtime as needed for sleep.  11/08/14   Historical Provider, MD    Family History Family History  Problem Relation Age of Onset  . Hypertension Mother   . Stroke Mother   . Heart disease Mother     Social History Social History  Substance Use Topics  . Smoking status: Current Every Day Smoker    Packs/day: 0.50    Years: 20.00  . Smokeless tobacco: Never Used  . Alcohol use Yes     Comment: rarely     Allergies   Eggs or egg-derived products; Fish allergy; Corn-containing products; and Sulfur   Review of Systems Review of Systems  Constitutional: Positive for chills. Negative for diaphoresis and fever.  Cardiovascular: Negative for chest pain.  Gastrointestinal: Positive for abdominal pain and nausea. Negative for diarrhea and vomiting.  Musculoskeletal: Positive for back pain.  All other systems reviewed and are negative.    Physical Exam Updated Vital Signs BP 159/81 (BP Location: Left Arm)   Pulse 72   Temp 98 F (36.7 C) (Oral)   Resp 18   SpO2 96%   Physical Exam  Constitutional: She is oriented to person, place, and time. She appears well-developed and well-nourished.  HENT:  Head: Normocephalic and atraumatic.  Eyes: EOM are normal. Pupils are equal, round, and reactive to light.  Neck: Normal range of motion. Neck supple. No JVD present.  Cardiovascular: Normal rate, regular rhythm and normal heart sounds.   No murmur heard. Pulmonary/Chest: Effort normal and breath sounds normal. She has no wheezes. She has no rales. She exhibits no tenderness.  Abdominal: Soft. She exhibits no distension and no mass. There is tenderness.  Tenderness to the right side of the abdomen with maximum tenderness in the right mid abdomen. Bowel signs decreased  Musculoskeletal: Normal range of motion. She exhibits no edema.  Lymphadenopathy:    She has no cervical adenopathy.  Neurological: She is alert and oriented to person, place, and  time. No cranial nerve deficit. She exhibits normal muscle tone. Coordination normal.  Skin: Skin is warm and dry. No rash noted.  Psychiatric: She has a normal mood and affect. Her behavior is normal. Judgment and thought content normal.  Nursing note and vitals reviewed.    ED Treatments / Results  DIAGNOSTIC STUDIES:  Oxygen Saturation is 96% on RA, normal by my interpretation.    COORDINATION OF CARE:  12:31 AM Discussed treatment plan with pt at bedside and pt agreed to plan.  Labs (all labs ordered are listed, but only abnormal results are displayed) Labs Reviewed  COMPREHENSIVE METABOLIC PANEL - Abnormal; Notable for the following:  Result Value   Sodium 134 (*)    Chloride 98 (*)    Glucose, Bld 100 (*)    Alkaline Phosphatase 129 (*)    All other components within normal limits  URINALYSIS, ROUTINE W REFLEX MICROSCOPIC - Abnormal; Notable for the following:    Hgb urine dipstick MODERATE (*)    Protein, ur 30 (*)    Bacteria, UA RARE (*)    Squamous Epithelial / LPF 0-5 (*)    All other components within normal limits  CBC WITH DIFFERENTIAL/PLATELET  LIPASE, BLOOD    Radiology Ct Abdomen Pelvis W Contrast  Result Date: 08/15/2016 CLINICAL DATA:  Abdominal pain, nausea and vomiting. History of repair duodenal ulcer repair, hysterectomy and appendectomy. EXAM: CT ABDOMEN AND PELVIS WITH CONTRAST TECHNIQUE: Multidetector CT imaging of the abdomen and pelvis was performed using the standard protocol following bolus administration of intravenous contrast. CONTRAST:  121mL ISOVUE-300 IOPAMIDOL (ISOVUE-300) INJECTION 61% COMPARISON:  12/30/2015 CT FINDINGS: Lower chest: Normal size cardiac chambers without pericardial effusion. Small hiatal hernia. Bibasilar atelectasis. Hepatobiliary: The gallbladder distention with trace pericholecystic fluid. No gallstones identified. There is mild intra and extrahepatic ductal dilatation. No space-occupying mass liver. Pancreas:   Unremarkable.  Pancreatic duct is top normal in caliber. Spleen: No splenomegaly. Adrenals/Urinary Tract: Stable bilateral adrenal masses consistent with adenomas, on the right measuring approximately 1.9 x 1.1 cm versus 1.6 x 1.1 cm previously and on the left 3.3 x 0.4 cm versus 3 x 2.7 cm. Please note that on prior CT report, the measurements for reversed of the nodules. Both kidneys enhance appropriately without obstructive uropathy or solid renal mass. The ureters or bladder is distended without calculus. Stomach/Bowel: Moderate to marked contrast filled stomach. Contrast passes into jejunum without extravasation or obstruction. There is mild to moderate contrast filled distended appearance of the jejunum. A mild enteritis not excluded. Moderate colonic stool burden is seen without bowel obstruction. The patient is status post appendectomy. 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 No adenopathy. Reproductive: Hysterectomy.  No adnexal mass. Other: None Musculoskeletal: No acute nor suspicious osseous abnormality.  Move IMPRESSION: 1. Distended gallbladder without gallstones. Small amount pericholecystic fluid identified. Slight interval increase in intrahepatic and extrahepatic ductal dilatation without the choledocholithiasis. Acalculous cholecystitis is of not entirely excluded. HIDA scan may help for further correlation. 2. 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 3. Mild contrast filled small bowel distention question enteritis. 4. Aortoiliac atherosclerosis without aneurysm. 5. Hysterectomy and appendectomy. Electronically Signed   By: Ashley Royalty M.D.   On: 08/15/2016 03:24   US Abdomen Limited Ruq  Result Date: 08/15/2016 CLINICAL DATA:  61 year old female with right upper quadrant abdominal pain. EXAM: US ABDOMEN LIMITED - RIGHT UPPER QUADRANT COMPARISON:  CT dated 08/15/2016 FINDINGS: Gallbladder: No gallstones or wall thickening visualized. No  sonographic Murphy sign noted by sonographer. There is trace pericholecystic fluid. Common bile duct: Diameter: 3 mm Liver: There is slight coarsened appearance of the hepatic echotexture may represent early changes of cirrhosis. Clinical correlation is recommended. IMPRESSION: Unremarkable gallbladder. Slight coarsened hepatic echotexture may represent early changes of cirrhosis. Small pericholecystic fluid. Electronically Signed   By: Anner Crete M.D.   On: 08/15/2016 04:29    Procedures Procedures (including critical care time)  Medications Ordered in ED Medications  iopamidol (ISOVUE-300) 61 % injection (not administered)  ondansetron (ZOFRAN) injection 4 mg (4 mg Intravenous Given 08/15/16 0045)  morphine 4 MG/ML injection 4 mg (4 mg Intravenous Given 08/15/16 0045)  metoCLOPramide (REGLAN) injection  10 mg (10 mg Intravenous Given 08/15/16 0312)  iopamidol (ISOVUE-300) 61 % injection (100 mLs Intravenous Contrast Given 08/15/16 0250)     Initial Impression / Assessment and Plan / ED Course  I have reviewed the triage vital signs and the nursing notes.  Pertinent labs & imaging results that were available during my care of the patient were reviewed by me and considered in my medical decision making (see chart for details).  Right-sided abdominal pain of uncertain cause. Maximum pain seems to be inferior to the gallbladder. She is sent for CT of abdomen and pelvis which shows a distended gallbladder. Laboratory workup shows minimal elevation tobacco and phosphatase which is probably not clinically significant. She sent for ultrasound of the gallbladder which was unremarkable although did suggest possible early cirrhosis. Old records are reviewed, and she been seen for abdominal pain in June 2017 at which time CT of abdomen and pelvis was unremarkable. She is referred to gastroenterology for follow-up. She is discharged with prescriptions for oxycodone have acetaminophen, and  metoclopramide.  Final Clinical Impressions(s) / ED Diagnoses   Final diagnoses:  Abdominal pain, unspecified abdominal location    New Prescriptions New Prescriptions   METOCLOPRAMIDE (REGLAN) 10 MG TABLET    Take 1 tablet (10 mg total) by mouth every 6 (six) hours as needed.   I personally performed the services described in this documentation, which was scribed in my presence. The recorded information has been reviewed and is accurate.       Delora Fuel, MD [id number]

## 2016-08-15 NOTE — ED Notes (Signed)
Pt verbalized understanding of d/c instructions and has no further questions. Pt is stable, A&Ox4, VSS.  

## 2016-08-15 NOTE — ED Notes (Signed)
EDP aware pt having episode of emesis. Primary RN aware

## 2016-08-15 NOTE — ED Notes (Signed)
Patient transported to CT 

## 2016-08-15 NOTE — ED Notes (Signed)
Sent label to main lab to add on lipase

## 2016-08-15 NOTE — Discharge Instructions (Signed)
Return if pain is getting worse. °

## 2017-04-16 ENCOUNTER — Encounter (HOSPITAL_COMMUNITY): Payer: Self-pay | Admitting: Emergency Medicine

## 2017-04-16 ENCOUNTER — Emergency Department (HOSPITAL_COMMUNITY)
Admission: EM | Admit: 2017-04-16 | Discharge: 2017-04-16 | Discharge status: Against Medical Advice | Payer: BLUE CROSS/BLUE SHIELD | Attending: Emergency Medicine | Admitting: Emergency Medicine

## 2017-04-16 DIAGNOSIS — Z5321 Procedure and treatment not carried out due to patient leaving prior to being seen by health care provider: Secondary | ICD-10-CM | POA: Diagnosis not present

## 2017-04-16 DIAGNOSIS — R109 Unspecified abdominal pain: Secondary | ICD-10-CM | POA: Diagnosis present

## 2017-04-16 HISTORY — DX: Nonrheumatic mitral (valve) insufficiency: I34.0

## 2017-04-16 LAB — COMPREHENSIVE METABOLIC PANEL
ALBUMIN: 3.8 g/dL (ref 3.5–5.0)
ALT: 12 U/L — ABNORMAL LOW (ref 14–54)
ANION GAP: 8 (ref 5–15)
AST: 16 U/L (ref 15–41)
Alkaline Phosphatase: 95 U/L (ref 38–126)
BILIRUBIN TOTAL: 0.4 mg/dL (ref 0.3–1.2)
BUN: 7 mg/dL (ref 6–20)
CALCIUM: 10.1 mg/dL (ref 8.9–10.3)
CO2: 26 mmol/L (ref 22–32)
Chloride: 97 mmol/L — ABNORMAL LOW (ref 101–111)
Creatinine, Ser: 0.57 mg/dL (ref 0.44–1.00)
Glucose, Bld: 94 mg/dL (ref 65–99)
POTASSIUM: 3.9 mmol/L (ref 3.5–5.1)
Sodium: 131 mmol/L — ABNORMAL LOW (ref 135–145)
TOTAL PROTEIN: 7 g/dL (ref 6.5–8.1)

## 2017-04-16 LAB — CBC
HEMATOCRIT: 40.2 % (ref 36.0–46.0)
HEMOGLOBIN: 13.8 g/dL (ref 12.0–15.0)
MCH: 33.8 pg (ref 26.0–34.0)
MCHC: 34.3 g/dL (ref 30.0–36.0)
MCV: 98.5 fL (ref 78.0–100.0)
Platelets: 303 10*3/uL (ref 150–400)
RBC: 4.08 MIL/uL (ref 3.87–5.11)
RDW: 13.9 % (ref 11.5–15.5)
WBC: 10.3 10*3/uL (ref 4.0–10.5)

## 2017-04-16 LAB — LIPASE, BLOOD: Lipase: 34 U/L (ref 11–51)

## 2017-04-16 NOTE — ED Triage Notes (Addendum)
Pt from home with c/o abdominal pain that began in right side of abdominal pain that has switched to left abdomen. Pt states she has hx of htn and takes her medication regularly as she is instructed to. Pt states her blood pressure was reading high (200/109) at home. Pt's blood pressure has decreased since then. Pt states she has since taken her blood pressure. Pt has hx of MI. Pt states she also has slurred speech. None is noted by this RN. Pt has normal neuro exam with equal strength and sensation in all extremities

## 2017-05-16 ENCOUNTER — Other Ambulatory Visit: Payer: Self-pay | Admitting: Internal Medicine

## 2017-05-16 ENCOUNTER — Ambulatory Visit
Admission: RE | Admit: 2017-05-16 | Discharge: 2017-05-16 | Disposition: A | Discharge status: Home / Self Care | Payer: BLUE CROSS/BLUE SHIELD | Source: Ambulatory Visit | Attending: Internal Medicine | Admitting: Internal Medicine

## 2017-05-16 DIAGNOSIS — J449 Chronic obstructive pulmonary disease, unspecified: Secondary | ICD-10-CM

## 2017-06-12 ENCOUNTER — Encounter (HOSPITAL_COMMUNITY): Payer: Self-pay

## 2018-02-22 IMAGING — CT CT ABD-PELV W/ CM
2 of 5 series · 16 of 46 positions shown, 18 images · IV contrast (Omni 300)
Comparison: 12/30/2015 CT

CLINICAL DATA: Abdominal pain, nausea and vomiting. History of
repair duodenal ulcer repair, hysterectomy and appendectomy.

EXAM:
CT ABDOMEN AND PELVIS WITH CONTRAST
TECHNIQUE: Multidetector CT imaging of the abdomen and pelvis was performed
using the standard protocol following bolus administration of
intravenous contrast.
CONTRAST:  100mL [id number] IOPAMIDOL ([id number]) INJECTION 61%

[Series 2: a/p w/ 5mm · axial · 0.69mm/px · z∈[+794,+1179]mm · 13 of 87 slices shown, 15 images]
[im 5/87  soft-tissue]
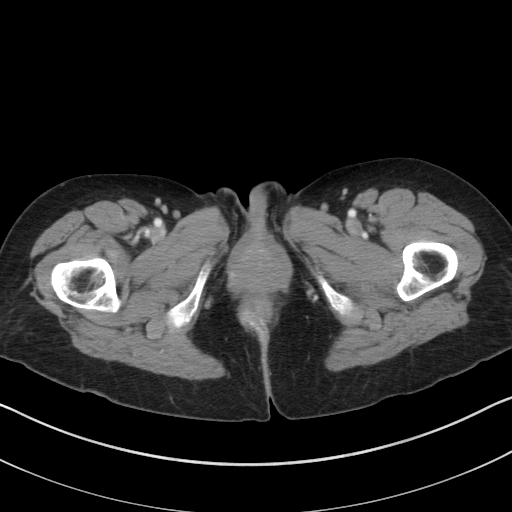
[im 5/87  bone]
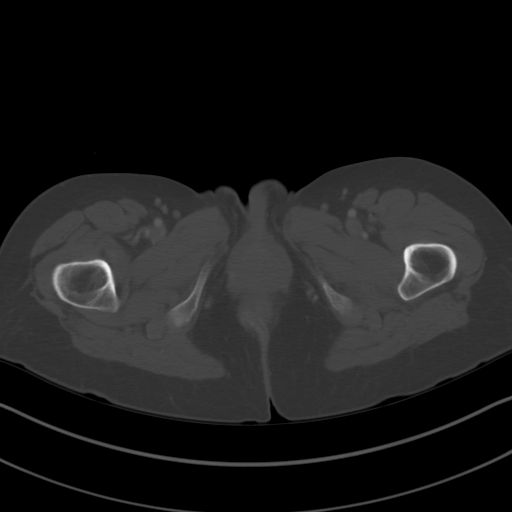
[im 10/87  soft-tissue]
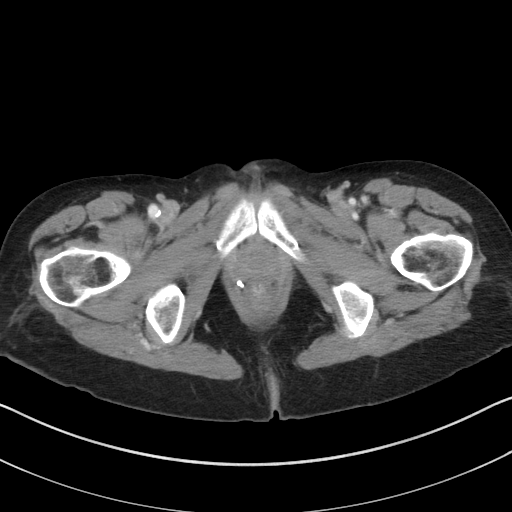
[im 20/87  soft-tissue]
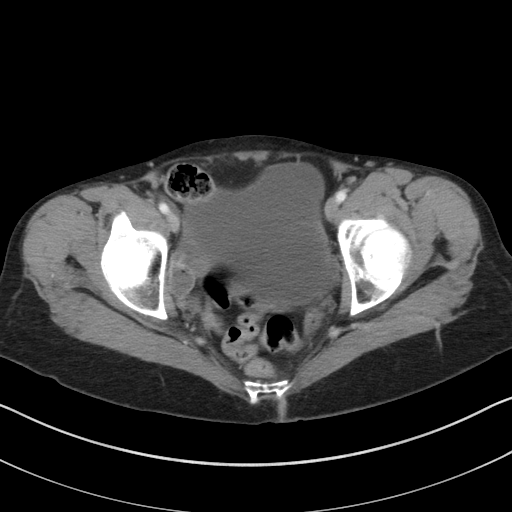
[im 24/87  soft-tissue]
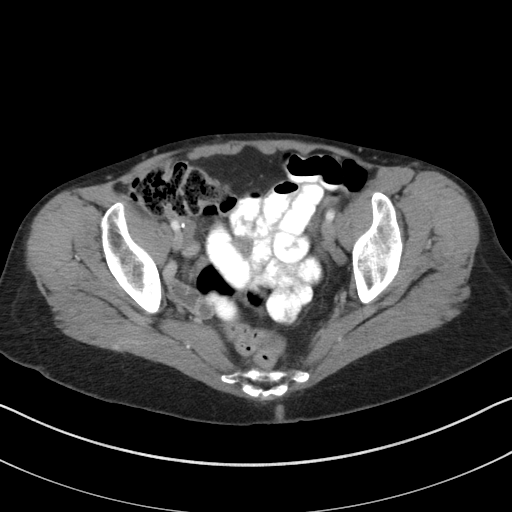
[im 29/87  soft-tissue]
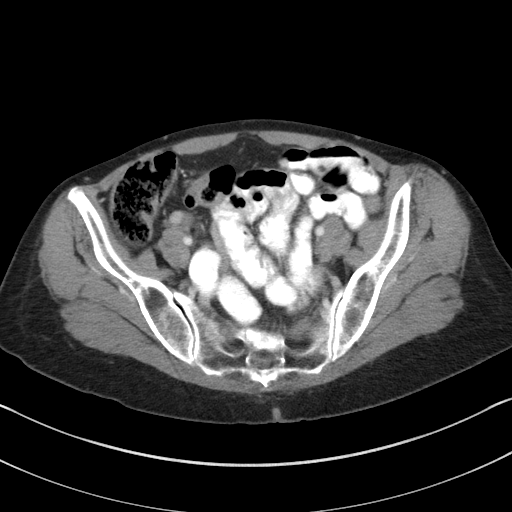
[im 39/87  soft-tissue]
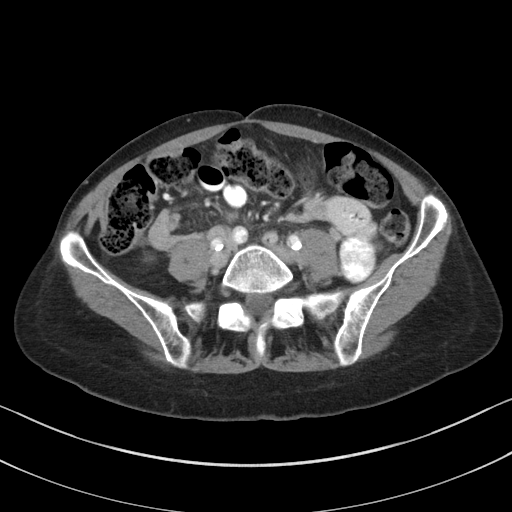
[im 44/87  soft-tissue]
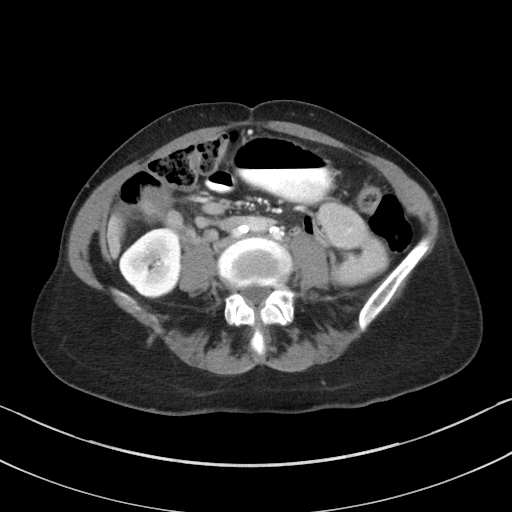
[im 48/87  soft-tissue]
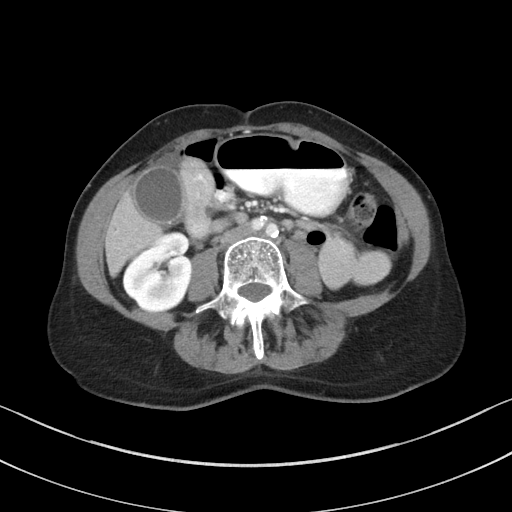
[im 58/87  soft-tissue]
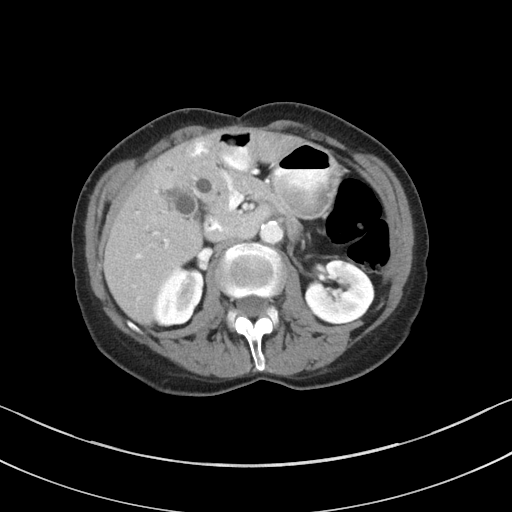
[im 58/87  bone]
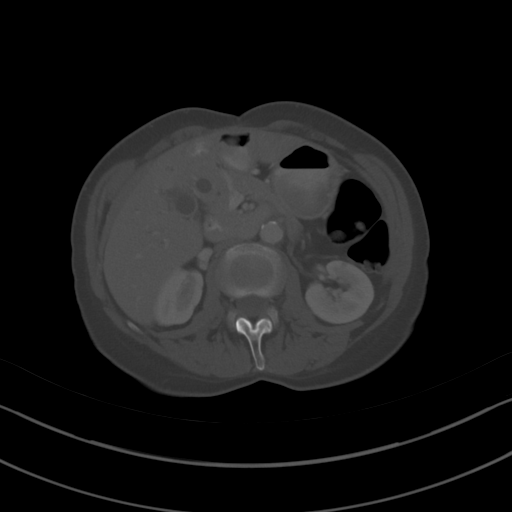
[im 63/87  soft-tissue]
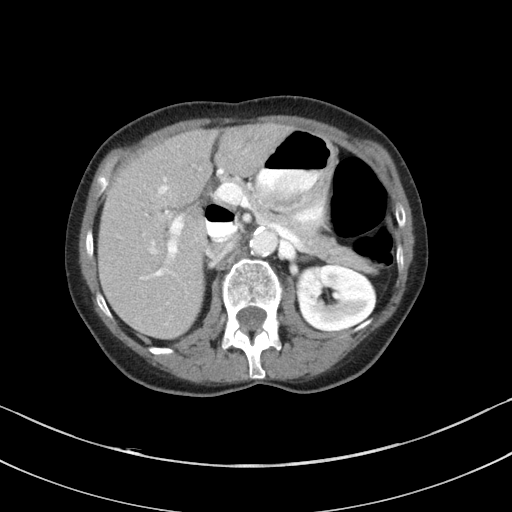
[im 67/87  soft-tissue]
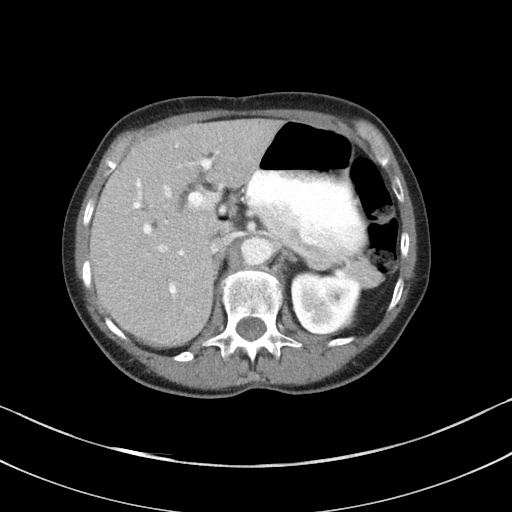
[im 77/87  soft-tissue]
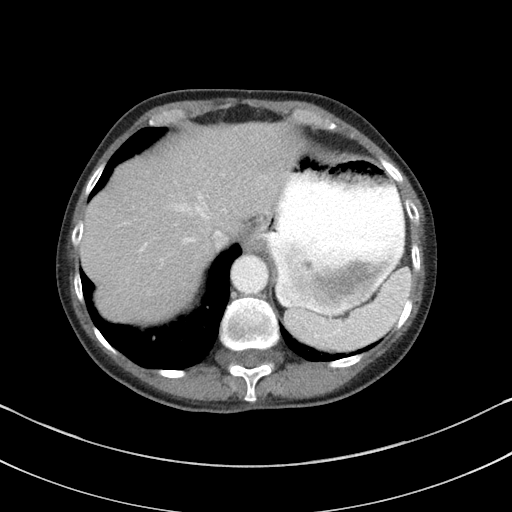
[im 82/87  soft-tissue]
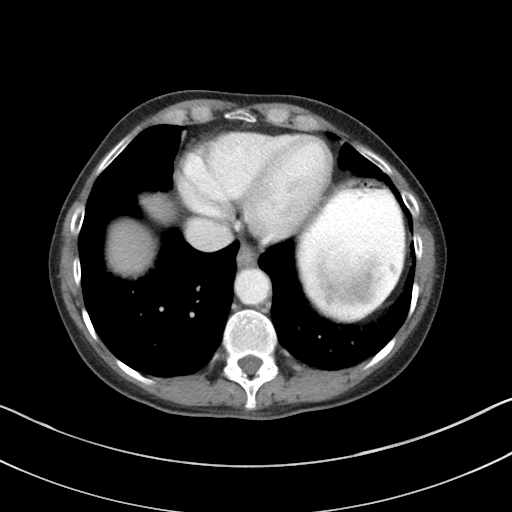

[Series 5: a/p w/ cor · coronal · 0.77mm/px · 3 of 139 slices shown]
[im 47/139  soft-tissue]
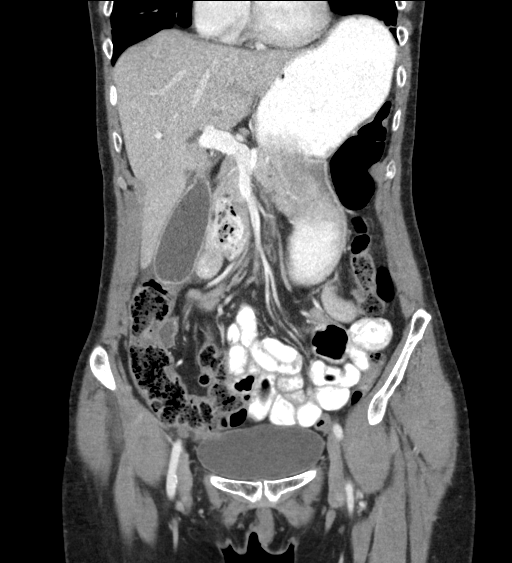
[im 62/139  soft-tissue]
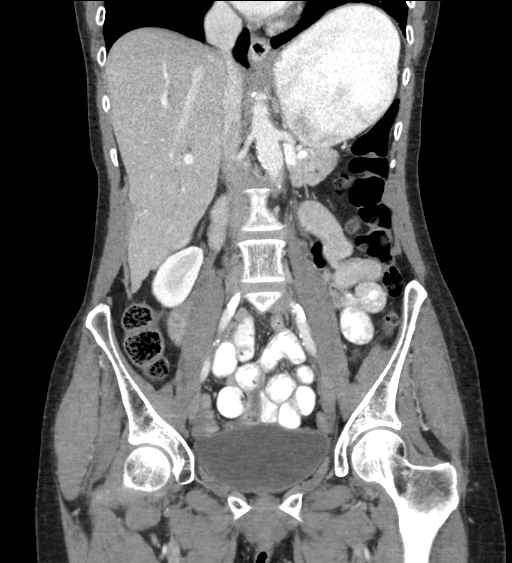
[im 77/139  soft-tissue]
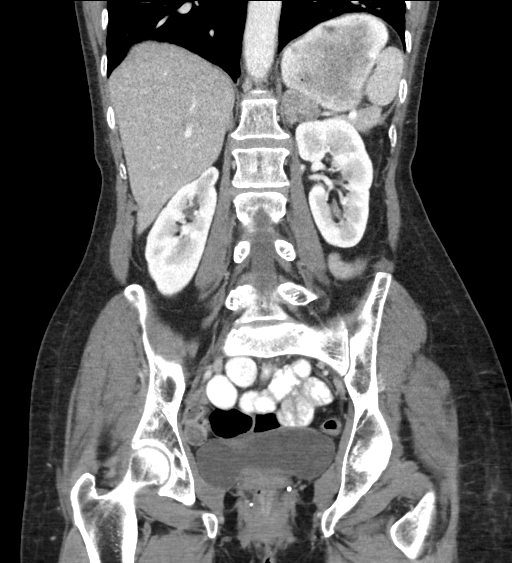

[16 of 46 positions shown; findings below may reference images not displayed]

FINDINGS: Lower chest: Normal size cardiac chambers without pericardial
effusion. Small hiatal hernia. Bibasilar atelectasis.

Hepatobiliary: The gallbladder distention with trace pericholecystic
fluid. No gallstones identified. There is mild intra and
extrahepatic ductal dilatation. No space-occupying mass liver.

Pancreas:  Unremarkable.  Pancreatic duct is top normal in caliber.

Spleen: No splenomegaly.

Adrenals/Urinary Tract: Stable bilateral adrenal masses consistent
with adenomas, on the right measuring approximately 1.9 x 1.1 cm
versus 1.6 x 1.1 cm previously and on the left 3.3 x 0.4 cm versus 3
x 2.7 cm. Please note that on prior CT report, the measurements for
reversed of the nodules. Both kidneys enhance appropriately without
obstructive uropathy or solid renal mass. The ureters or bladder is
distended without calculus.

Stomach/Bowel: Moderate to marked contrast filled stomach. Contrast
passes into jejunum without extravasation or obstruction. There is
mild to moderate contrast filled distended appearance of the
jejunum. A mild enteritis not excluded. Moderate colonic stool
burden is seen without bowel obstruction. The patient is status post
appendectomy.

Vascular/Lymphatic: The aortoiliac atherosclerosis without aneurysm.
No adenopathy.

Reproductive: Hysterectomy.  No adnexal mass.

Other: None

Musculoskeletal: No acute nor suspicious osseous abnormality.  Move
IMPRESSION: 1. Distended gallbladder without gallstones. Small amount
pericholecystic fluid identified. Slight interval increase in
intrahepatic and extrahepatic ductal dilatation without the
choledocholithiasis. Acalculous cholecystitis is of not entirely
excluded. HIDA scan may help for further correlation.
2. Stable bilateral adrenal masses consistent with adenomas.
3. Mild contrast filled small bowel distention question enteritis.
4. Aortoiliac atherosclerosis without aneurysm.
5. Hysterectomy and appendectomy.

## 2018-06-16 ENCOUNTER — Other Ambulatory Visit: Payer: Self-pay | Admitting: Internal Medicine

## 2018-06-16 ENCOUNTER — Ambulatory Visit
Admission: RE | Admit: 2018-06-16 | Discharge: 2018-06-16 | Disposition: A | Discharge status: Home / Self Care | Payer: BLUE CROSS/BLUE SHIELD | Source: Ambulatory Visit | Attending: Internal Medicine | Admitting: Internal Medicine

## 2018-06-16 DIAGNOSIS — R059 Cough, unspecified: Secondary | ICD-10-CM

## 2018-06-16 DIAGNOSIS — R05 Cough: Secondary | ICD-10-CM

## 2020-02-08 ENCOUNTER — Other Ambulatory Visit: Payer: Self-pay | Admitting: Internal Medicine

## 2020-02-08 DIAGNOSIS — R1011 Right upper quadrant pain: Secondary | ICD-10-CM

## 2020-02-09 ENCOUNTER — Ambulatory Visit
Admission: RE | Admit: 2020-02-09 | Discharge: 2020-02-09 | Disposition: A | Discharge status: Home / Self Care | Payer: Medicare Other | Source: Ambulatory Visit | Attending: Internal Medicine | Admitting: Internal Medicine

## 2020-02-09 ENCOUNTER — Other Ambulatory Visit: Payer: Self-pay

## 2020-02-09 DIAGNOSIS — R1011 Right upper quadrant pain: Secondary | ICD-10-CM

## 2020-02-17 ENCOUNTER — Other Ambulatory Visit: Payer: Self-pay | Admitting: Internal Medicine

## 2020-02-17 DIAGNOSIS — R109 Unspecified abdominal pain: Secondary | ICD-10-CM

## 2020-03-01 ENCOUNTER — Ambulatory Visit
Admission: RE | Admit: 2020-03-01 | Discharge: 2020-03-01 | Disposition: A | Discharge status: Home / Self Care | Payer: Medicare Other | Source: Ambulatory Visit | Attending: Internal Medicine | Admitting: Internal Medicine

## 2020-03-01 DIAGNOSIS — R109 Unspecified abdominal pain: Secondary | ICD-10-CM

## 2020-03-01 MED ORDER — IOPAMIDOL (ISOVUE-300) INJECTION 61%
100.0000 mL | Freq: Once | INTRAVENOUS | Status: AC | PRN
  Administered 2020-03-01: 100 mL via INTRAVENOUS

## 2020-03-11 ENCOUNTER — Ambulatory Visit (INDEPENDENT_AMBULATORY_CARE_PROVIDER_SITE_OTHER): Payer: Medicare Other

## 2020-03-11 ENCOUNTER — Ambulatory Visit
Admission: EM | Admit: 2020-03-11 | Discharge: 2020-03-11 | Disposition: A | Discharge status: Home / Self Care | Payer: Medicare Other

## 2020-03-11 ENCOUNTER — Other Ambulatory Visit: Payer: Self-pay

## 2020-03-11 DIAGNOSIS — M79671 Pain in right foot: Secondary | ICD-10-CM

## 2020-03-11 DIAGNOSIS — S92424A Nondisplaced fracture of distal phalanx of right great toe, initial encounter for closed fracture: Secondary | ICD-10-CM | POA: Diagnosis not present

## 2020-03-11 DIAGNOSIS — W208XXA Other cause of strike by thrown, projected or falling object, initial encounter: Secondary | ICD-10-CM | POA: Diagnosis not present

## 2020-03-11 NOTE — ED Triage Notes (Signed)
Pt states dropped a concrete birdbath top on her rt foot this morning around 11am. States now having increase bruising, numbness, and swelling.

## 2020-03-11 NOTE — ED Provider Notes (Signed)
[facility]    CSN: [id number] Arrival date & time: 03/11/20  1428      History   Chief Complaint Chief Complaint  Patient presents with  . Foot Injury    HPI [name] [name] is a 64 y.o. female.   64 year old female comes in for right foot pain after injury this morning. Dropped concrete birdbath to the top of distal foot/toe. Pain with swelling, contusion, numbness to the toes. Elevation with minimal relief.      Past Medical History:  Diagnosis Date  . Abnormal Pap smear 1982  . Complication of anesthesia   . GERD (gastroesophageal reflux disease)    no meds  . Hyperlipidemia   . Hypertension   . Mental disorder    anxiety   . MI (mitral incompetence)   . Migraines   . PONV (postoperative nausea and vomiting)   . Skin cancer, basal cell     There are no problems to display for this patient.   Past Surgical History:  Procedure Laterality Date  . ABDOMINAL HYSTERECTOMY    . ANAL FISSURE REPAIR    . APPENDECTOMY    . COSMETIC SURGERY     facial-  . LAPAROTOMY  05/14/2012   Procedure: EXPLORATORY LAPAROTOMY;  Surgeon: Rolm Bookbinder, MD;  Location: [facility];  Service: General;  Laterality: N/A;  egd, lysis of adhesions, resection of duodenal diverticulm  . PARTIAL HYSTERECTOMY    . reconstructive eye surgery    . TONSILLECTOMY    . TRIGGER FINGER RELEASE Right 07/22/2014   Procedure: RIGHT RING FINGER TRIGGER RELEASE ;  Surgeon: Leanora Cover, MD;  Location: [facility];  Service: Orthopedics;  Laterality: Right;    OB History    Gravida  3   Para  3   Term  3   Preterm      AB      Living  3     SAB      TAB      Ectopic      Multiple      Live Births               Home Medications    Prior to Admission medications   Medication Sig Start Date End Date Taking? Authorizing Provider  tamsulosin (FLOMAX) 0.4 MG CAPS capsule Take 0.4 mg by mouth.   Yes [provider]  acetaminophen (TYLENOL)  500 MG tablet Take 1,000 mg by mouth every 6 (six) hours as needed for headache.    [provider]  albuterol (PROVENTIL HFA;VENTOLIN HFA) 108 (90 BASE) MCG/ACT inhaler Inhale 2 puffs into the lungs every 4 (four) hours as needed for wheezing or shortness of breath. 04/11/15   Jola Schmidt, MD  ALPRAZolam Duanne Moron) 0.25 MG tablet Take 0.25 mg by mouth at bedtime as needed for anxiety.     [provider]  butalbital-acetaminophen-caffeine (FIORICET, ESGIC) 50-325-40 MG per tablet Take 1 tablet by mouth every 6 (six) hours as needed for migraine. Migraine    [provider]  metoprolol tartrate (LOPRESSOR) 25 MG tablet Take 25 mg by mouth 2 (two) times daily.    [provider]  nitroGLYCERIN (NITROSTAT) 0.4 MG SL tablet Place 0.4 mg under the tongue every 5 (five) minutes as needed. For chest pain    [provider]  Probiotic Product (PROBIOTIC & ACIDOPHILUS EX ST PO) Take 1 capsule by mouth daily.     [provider]  sertraline (ZOLOFT) 50  MG tablet Take 75 mg by mouth daily.     [provider]  simvastatin (ZOCOR) 20 MG tablet Take 20 mg by mouth every evening.    [provider]  traZODone (DESYREL) 50 MG tablet Take 50 mg by mouth at bedtime as needed for sleep.  11/08/14   [provider]    Family History Family History  Problem Relation Age of Onset  . Hypertension Mother   . Stroke Mother   . Heart disease Mother     Social History Social History   Tobacco Use  . Smoking status: Current Every Day Smoker    Packs/day: 0.50    Years: 20.00    Pack years: 10.00  . Smokeless tobacco: Never Used  Substance Use Topics  . Alcohol use: Yes    Comment: rarely  . Drug use: No     Allergies   Eggs or egg-derived products, Fish allergy, Corn-containing products, and Sulfur   Review of Systems Review of Systems  Reason unable to perform ROS: See HPI as above.     Physical Exam Triage Vital  Signs ED Triage Vitals  Enc Vitals Group     BP 03/11/20 1516 128/77     Pulse Rate 03/11/20 1516 72     Resp 03/11/20 1516 16     Temp 03/11/20 1516 98.2 F (36.8 C)     Temp Source 03/11/20 1516 Oral     SpO2 03/11/20 1516 98 %     Weight --      Height --      Head Circumference --      Peak Flow --      Pain Score 03/11/20 1524 6     Pain Loc --      Pain Edu? --      Excl. in [place]? --    No data found.  Updated Vital Signs BP 128/77 (BP Location: Left Arm)   Pulse 72   Temp 98.2 F (36.8 C) (Oral)   Resp 16   SpO2 98%    Physical Exam Constitutional:      General: She is not in acute distress.    Appearance: Normal appearance. She is well-developed. She is not toxic-appearing or diaphoretic.  HENT:     Head: Normocephalic and atraumatic.  Eyes:     Conjunctiva/sclera: Conjunctivae normal.     Pupils: Pupils are equal, round, and reactive to light.  Pulmonary:     Effort: Pulmonary effort is normal. No respiratory distress.  Musculoskeletal:     Cervical back: Normal range of motion and neck supple.     Comments: Diffuse swelling, contusion of right 1st-4th toe. Diffuse tenderness to palpation of toes. No tenderness to MTPs. Decreased flexion of toes due to pain and swelling. NVI  Skin:    General: Skin is warm and dry.  Neurological:     Mental Status: She is alert and oriented to person, place, and time.      UC Treatments / Results  Labs (all labs ordered are listed, but only abnormal results are displayed) Labs Reviewed - No data to display  EKG   Radiology DG Foot Complete Right  Result Date: 03/11/2020 CLINICAL DATA:  64 year old female with trauma to the right foot. EXAM: RIGHT FOOT COMPLETE - 3+ VIEW COMPARISON:  Right foot radiograph dated 12/22/2014 FINDINGS: There is a nondisplaced transverse fracture of the distal phalanx of the great toe. No other acute fracture. The bones are osteopenic. There is no  dislocation. Mild soft tissue swelling  of the great toe. IMPRESSION: Nondisplaced fracture of the distal phalanx of the great toe. Electronically Signed   By: Anner Crete M.D.   On: 03/11/2020 15:41    Procedures Procedures (including critical care time)  Medications Ordered in UC Medications - No data to display  Initial Impression / Assessment and Plan / UC Course  I have reviewed the triage vital signs and the nursing notes.  Pertinent labs & imaging results that were available during my care of the patient were reviewed by me and considered in my medical decision making (see chart for details).    Right great toe fracture to DIP. Possible avulsion fracture to 2nd toe DIP, 3rd to MIP on my review. However, discussed lesser toe fracture does not change treatment plan at this time. Symptomatic treatment discussed. Patient already with post op shoe at home due to prior surgery, to start wearing at this time. Return precautions given. Otherwise, follow up with orthopedics for further evaluation and management needed.  Final Clinical Impressions(s) / UC Diagnoses   Final diagnoses:  Closed nondisplaced fracture of distal phalanx of right great toe, initial encounter    ED Prescriptions    None     I have reviewed the PDMP during this encounter.   Ok Edwards, PA-C 03/11/20 1559

## 2020-03-11 NOTE — Discharge Instructions (Signed)
Ibuprofen 800mg  three times a day, tylenol 1000mg  three times a day. Ice compress, elevation, post op shoe during acitivty. Follow up with orthopedics for further evaluation of great toe fracture.

## 2020-05-23 ENCOUNTER — Ambulatory Visit
Admission: RE | Admit: 2020-05-23 | Discharge: 2020-05-23 | Disposition: A | Discharge status: Home / Self Care | Payer: Medicare Other | Source: Ambulatory Visit | Attending: Gastroenterology | Admitting: Gastroenterology

## 2020-05-23 ENCOUNTER — Other Ambulatory Visit: Payer: Self-pay | Admitting: Gastroenterology

## 2020-05-23 DIAGNOSIS — R140 Abdominal distension (gaseous): Secondary | ICD-10-CM

## 2020-08-26 ENCOUNTER — Other Ambulatory Visit: Payer: Self-pay | Admitting: Internal Medicine

## 2020-08-26 DIAGNOSIS — M810 Age-related osteoporosis without current pathological fracture: Secondary | ICD-10-CM

## 2020-08-29 ENCOUNTER — Other Ambulatory Visit: Payer: Self-pay | Admitting: Internal Medicine

## 2020-08-29 DIAGNOSIS — Z1231 Encounter for screening mammogram for malignant neoplasm of breast: Secondary | ICD-10-CM

## 2021-01-26 ENCOUNTER — Other Ambulatory Visit: Payer: Self-pay

## 2021-01-26 ENCOUNTER — Ambulatory Visit
Admission: RE | Admit: 2021-01-26 | Discharge: 2021-01-26 | Disposition: A | Discharge status: Home / Self Care | Payer: Medicare Other | Source: Ambulatory Visit | Attending: Internal Medicine | Admitting: Internal Medicine

## 2021-01-26 DIAGNOSIS — M810 Age-related osteoporosis without current pathological fracture: Secondary | ICD-10-CM

## 2021-01-26 DIAGNOSIS — Z1231 Encounter for screening mammogram for malignant neoplasm of breast: Secondary | ICD-10-CM

## 2023-09-24 ENCOUNTER — Other Ambulatory Visit: Payer: Self-pay | Admitting: Internal Medicine

## 2023-09-24 ENCOUNTER — Ambulatory Visit
Admission: RE | Admit: 2023-09-24 | Discharge: 2023-09-24 | Disposition: A | Discharge status: Home / Self Care | Source: Ambulatory Visit | Attending: Internal Medicine | Admitting: Internal Medicine

## 2023-09-24 DIAGNOSIS — J209 Acute bronchitis, unspecified: Secondary | ICD-10-CM
